# Patient Record
Sex: Female | Born: 1990 | Race: Black or African American | Hispanic: No | Marital: Single | Smoking: Current every day smoker
Health system: Southern US, Community
[De-identification: ages and names within clinical notes are randomized; demographics above are authoritative.]

## PROBLEM LIST (undated history)

## (undated) ENCOUNTER — Inpatient Hospital Stay (HOSPITAL_COMMUNITY): Payer: Self-pay

## (undated) DIAGNOSIS — B009 Herpesviral infection, unspecified: Secondary | ICD-10-CM

## (undated) DIAGNOSIS — Z5189 Encounter for other specified aftercare: Secondary | ICD-10-CM

## (undated) DIAGNOSIS — D649 Anemia, unspecified: Secondary | ICD-10-CM

## (undated) DIAGNOSIS — O139 Gestational [pregnancy-induced] hypertension without significant proteinuria, unspecified trimester: Secondary | ICD-10-CM

## (undated) DIAGNOSIS — I10 Essential (primary) hypertension: Secondary | ICD-10-CM

## (undated) HISTORY — DX: Herpesviral infection, unspecified: B00.9

## (undated) HISTORY — PX: WISDOM TOOTH EXTRACTION: SHX21

## (undated) HISTORY — PX: TONSILLECTOMY: SUR1361

---
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
## 2011-01-04 ENCOUNTER — Encounter: Payer: Self-pay | Admitting: *Deleted

## 2011-01-04 ENCOUNTER — Emergency Department (HOSPITAL_COMMUNITY)
Admission: EM | Admit: 2011-01-04 | Discharge: 2011-01-04 | Disposition: A | Discharge status: Home / Self Care | Payer: No Typology Code available for payment source | Attending: Emergency Medicine | Admitting: Emergency Medicine

## 2011-01-04 DIAGNOSIS — Y92410 Unspecified street and highway as the place of occurrence of the external cause: Secondary | ICD-10-CM | POA: Insufficient documentation

## 2011-01-04 DIAGNOSIS — T1490XA Injury, unspecified, initial encounter: Secondary | ICD-10-CM | POA: Insufficient documentation

## 2011-01-04 DIAGNOSIS — R109 Unspecified abdominal pain: Secondary | ICD-10-CM | POA: Insufficient documentation

## 2011-01-04 MED ORDER — IBUPROFEN 800 MG PO TABS: 800.0000 mg | ORAL_TABLET | Freq: Three times a day (TID) | ORAL | Status: AC

## 2011-01-04 MED ORDER — IBUPROFEN 800 MG PO TABS
800.0000 mg | ORAL_TABLET | Freq: Once | ORAL | Status: AC
  Administered 2011-01-04: 800 mg via ORAL
  Filled 2011-01-04: qty 1

## 2011-01-04 MED ORDER — CYCLOBENZAPRINE HCL 10 MG PO TABS: 10.0000 mg | ORAL_TABLET | Freq: Three times a day (TID) | ORAL | Status: AC | PRN

## 2011-01-04 MED ORDER — CYCLOBENZAPRINE HCL 10 MG PO TABS
10.0000 mg | ORAL_TABLET | Freq: Once | ORAL | Status: AC
  Administered 2011-01-04: 10 mg via ORAL
  Filled 2011-01-04: qty 1
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
## 2011-01-04 NOTE — ED Notes (Signed)
Rx given to pt. 
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
## 2011-01-05 NOTE — ED Provider Notes (Signed)
Medical screening examination/treatment/procedure(s) were performed by non-physician practitioner and as supervising physician I was immediately available for consultation/collaboration.  Tagen Brethauer, MD 01/05/11 0052 

## 2011-02-05 NOTE — L&D Delivery Note (Signed)
Delivery Note Pt reached complete dilation and pushed great.  At 2:00 PM a viable female was delivered via Vaginal, Spontaneous Delivery (Presentation: LOA).  APGAR: 8,9 ; weight pending .   Placenta status: manuel extraction--removed in pieces and fundal check is clear, .  Cord: 3 vessels with the following complications: corporal x 1    Anesthesia: Epidural  Episiotomy: none  Lacerations: none  Suture Repair: none Est. Blood Loss (mL): 300cc  Mom to postpartum.  Baby to stay with mom.Marland Kitchen  [name] 02/01/2012, 2:16 PM

## 2011-05-06 ENCOUNTER — Encounter (HOSPITAL_COMMUNITY): Payer: Self-pay | Admitting: *Deleted

## 2011-05-06 ENCOUNTER — Emergency Department (HOSPITAL_COMMUNITY)
Admission: EM | Admit: 2011-05-06 | Discharge: 2011-05-06 | Disposition: A | Discharge status: Home / Self Care | Payer: Self-pay | Attending: Emergency Medicine | Admitting: Emergency Medicine

## 2011-05-06 ENCOUNTER — Emergency Department (HOSPITAL_COMMUNITY): Payer: Self-pay

## 2011-05-06 DIAGNOSIS — R6883 Chills (without fever): Secondary | ICD-10-CM | POA: Insufficient documentation

## 2011-05-06 DIAGNOSIS — R1013 Epigastric pain: Secondary | ICD-10-CM | POA: Insufficient documentation

## 2011-05-06 DIAGNOSIS — R42 Dizziness and giddiness: Secondary | ICD-10-CM | POA: Insufficient documentation

## 2011-05-06 LAB — COMPREHENSIVE METABOLIC PANEL
ALT: 15 U/L (ref 0–35)
AST: 18 U/L (ref 0–37)
Albumin: 4.6 g/dL (ref 3.5–5.2)
Alkaline Phosphatase: 47 U/L (ref 39–117)
BUN: 13 mg/dL (ref 6–23)
Chloride: 101 mEq/L (ref 96–112)
Potassium: 4 mEq/L (ref 3.5–5.1)
Sodium: 138 mEq/L (ref 135–145)
Total Bilirubin: 0.4 mg/dL (ref 0.3–1.2)

## 2011-05-06 LAB — CBC
Hemoglobin: 12.1 g/dL (ref 12.0–15.0)
MCHC: 32.5 g/dL (ref 30.0–36.0)
RBC: 4.23 MIL/uL (ref 3.87–5.11)

## 2011-05-06 LAB — POCT PREGNANCY, URINE: Preg Test, Ur: NEGATIVE

## 2011-05-06 LAB — DIFFERENTIAL
Basophils Relative: 0 % (ref 0–1)
Monocytes Relative: 5 % (ref 3–12)
Neutro Abs: 3.9 10*3/uL (ref 1.7–7.7)
Neutrophils Relative %: 65 % (ref 43–77)

## 2011-05-06 LAB — URINALYSIS, ROUTINE W REFLEX MICROSCOPIC
Glucose, UA: NEGATIVE mg/dL
Leukocytes, UA: NEGATIVE
Protein, ur: NEGATIVE mg/dL
pH: 7.5 (ref 5.0–8.0)

## 2011-05-06 LAB — PREGNANCY, URINE: Preg Test, Ur: NEGATIVE

## 2011-05-06 MED ORDER — SODIUM CHLORIDE 0.9 % IV BOLUS (SEPSIS)
1000.0000 mL | Freq: Once | INTRAVENOUS | Status: AC
  Administered 2011-05-06: 1000 mL via INTRAVENOUS

## 2011-05-06 MED ORDER — IOHEXOL 300 MG/ML  SOLN
100.0000 mL | Freq: Once | INTRAMUSCULAR | Status: AC | PRN
  Administered 2011-05-06: 100 mL via INTRAVENOUS

## 2011-05-06 MED ORDER — IBUPROFEN 600 MG PO TABS: 600.0000 mg | ORAL_TABLET | Freq: Four times a day (QID) | ORAL | Status: AC | PRN

## 2011-05-06 MED ORDER — MORPHINE SULFATE 4 MG/ML IJ SOLN
4.0000 mg | Freq: Once | INTRAMUSCULAR | Status: AC
  Administered 2011-05-06: 4 mg via INTRAVENOUS
  Filled 2011-05-06: qty 1

## 2011-05-06 MED ORDER — KETOROLAC TROMETHAMINE 30 MG/ML IJ SOLN
30.0000 mg | Freq: Once | INTRAMUSCULAR | Status: AC
  Administered 2011-05-06: 30 mg via INTRAVENOUS
  Filled 2011-05-06: qty 1

## 2011-05-06 MED ORDER — FAMOTIDINE 20 MG PO TABS: 20.0000 mg | ORAL_TABLET | Freq: Two times a day (BID) | ORAL | Status: DC

## 2011-05-06 NOTE — ED Notes (Signed)
Pt states she started her menstrual cycle 4 days ago and started to have right side abdominal pain . Pt states the pain fells like a not. Pt denies any n/v/d. Pt states she is unsure if she can be pregnant

## 2011-05-06 NOTE — ED Notes (Signed)
Pt states "my stomach has been hurting for 4 days"; pt denies n/v/d

## 2011-05-06 NOTE — Discharge Instructions (Signed)
Abdominal Pain Abdominal pain can be caused by many things. Your caregiver decides the seriousness of your pain by an examination and possibly blood tests and X-rays. Many cases can be observed and treated at home. Most abdominal pain is not caused by a disease and will probably improve without treatment. However, in many cases, more time must pass before a clear cause of the pain can be found. Before that point, it may not be known if you need more testing, or if hospitalization or surgery is needed. HOME CARE INSTRUCTIONS   Do not take laxatives unless directed by your caregiver.   Take pain medicine only as directed by your caregiver.   Only take over-the-counter or prescription medicines for pain, discomfort, or fever as directed by your caregiver.   Try a clear liquid diet (broth, tea, or water) for as long as directed by your caregiver. Slowly move to a bland diet as tolerated.  SEEK IMMEDIATE MEDICAL CARE IF:   The pain does not go away.   You have a fever.   You keep throwing up (vomiting).   The pain is felt only in portions of the abdomen. Pain in the right side could possibly be appendicitis. In an adult, pain in the left lower portion of the abdomen could be colitis or diverticulitis.   You pass bloody or black tarry stools.  MAKE SURE YOU:   Understand these instructions.   Will watch your condition.   Will get help right away if you are not doing well or get worse.  Document Released: 10/31/2004 Document Revised: 01/10/2011 Document Reviewed: 09/09/2007 ExitCare Patient Information 2012 [employer]  RESOURCE GUIDE  Dental Problems  Patients with Medicaid: [facility]                     [facility] [address]                                           [address] Phone:  [phone]                                                   Phone:  [phone]  If unable to pay or uninsured, contact:  [facility] or [place] [facility] to become qualified for the adult dental clinic.  Chronic Pain Problems Contact [facility]  [phone] Patients need to be referred by their primary care doctor.  Insufficient Money for Medicine Contact [employer] Way:  call "211" or [facility] 271-5999.  No Primary Care Doctor Call [facility]  [phone] Other agencies that provide inexpensive medical care    [facility]  [phone]    [facility]  [phone]    [facility]  [phone]    [facility]  [phone]    [facility]  [phone]    [facility]  [phone]  Psychological Services [facility]  [phone] [facility]  [phone] [facility]   [phone] (emergency services [phone])  Abuse/Neglect [place] Child Abuse Hotline (336) 641-3795 [place] Child Abuse Hotline 800-378-5315 (After Hours)  Emergency Shelter  [facility] (336) 271-5985  Maternity Homes   [facility] (336) 275-9566 [facility] (704) 372-4663  MRSA Hotline #:   832-7006    [place] [facility]  [facility] [place]  [employer]                           [facility] [facility] [address] [place]                     [address]         [address]  [place]                                               [place]                              [place] Phone:  [phone]                                  Phone:  [phone]                   Phone:  [phone]  [facility] Phone:  [phone]  [place] Child Abuse Hotline (336) 342-1394 (336) 342-3537 (After Hours)  

## 2011-05-06 NOTE — ED Provider Notes (Signed)
History     CSN: [id number]  Arrival date & time 05/06/11  5784   First MD Initiated Contact with Patient 05/06/11 1005      Chief Complaint  Patient presents with  . Abdominal Pain    (Consider location/radiation/quality/duration/timing/severity/associated sxs/prior treatment) HPI  20yoF pw abdominal pain 8/10, LUQ and lt flank. Constant "knot like pain". Denies N/V/f. +Chills. Felt lightheaded yesterday at work. Not worsening with movement or eating. Denies back pain/vaginal discharge/constipation/diarrhea. Denies hematuria/dysuria/freq/urgency. Has tried midol, motrin, for the pain without relief. Currently at end of menstrual cycle, no vaginal bleeding today. Concerned about being pregnant. Denies h/o VTE in self or family. No recent hosp/surg/immob. No h/o cancer. Denies exogenous hormone use, no leg pain or swelling  No abdominal surgeries No h/o nephrolithiasis    Past Medical History  Diagnosis Date  . Asthma     as a child    Past Surgical History  Procedure Date  . Tonsillectomy     No family history on file.  History  Substance Use Topics  . Smoking status: Former Games developer  . Smokeless tobacco: Not on file  . Alcohol Use: No    OB History    Grav Para Term Preterm Abortions TAB SAB Ect Mult Living                  Review of Systems  All other systems reviewed and are negative.  except as noted HPI   Allergies  Review of patient's allergies indicates no known allergies.  Home Medications   Current Outpatient Rx  Name Route Sig Dispense Refill  . MIDOL MAXIMUM STRENGTH PO Oral Take 2 tablets by mouth every 4 (four) hours as needed. cramps    . IBUPROFEN 200 MG PO TABS Oral Take 400 mg by mouth every 6 (six) hours as needed. PAIN    . FAMOTIDINE 20 MG PO TABS Oral Take 1 tablet (20 mg total) by mouth 2 (two) times daily. 30 tablet 0  . IBUPROFEN 600 MG PO TABS Oral Take 1 tablet (600 mg total) by mouth every 6 (six) hours as needed for pain. 30  tablet 0    BP 102/65  Pulse 60  Temp(Src) 98.7 F (37.1 C) (Oral)  Resp 18  Wt 135 lb (61.236 kg)  SpO2 100%  LMP 05/02/2011  Physical Exam  Nursing note and vitals reviewed. Constitutional: She is oriented to person, place, and time. She appears well-developed.  HENT:  Head: Atraumatic.  Mouth/Throat: Oropharynx is clear and moist.  Eyes: Conjunctivae and EOM are normal. Pupils are equal, round, and reactive to light.  Neck: Normal range of motion. Neck supple.  Cardiovascular: Normal rate, regular rhythm, normal heart sounds and intact distal pulses.   Pulmonary/Chest: Effort normal and breath sounds normal. No respiratory distress. She has no wheezes. She has no rales.  Abdominal: Soft. She exhibits no distension. There is tenderness. There is no rebound and no guarding.       +epigastric ttp No lower abd pain  Musculoskeletal: Normal range of motion.  Neurological: She is alert and oriented to person, place, and time.  Skin: Skin is warm and dry. No rash noted.  Psychiatric: She has a normal mood and affect.    ED Course  Procedures (including critical care time)  Labs Reviewed  URINALYSIS, ROUTINE W REFLEX MICROSCOPIC - Abnormal; Notable for the following:    APPearance CLOUDY (*)    All other components within normal limits  CBC - Abnormal; Notable  for the following:    Platelets 417 (*)    All other components within normal limits  PREGNANCY, URINE  DIFFERENTIAL  COMPREHENSIVE METABOLIC PANEL  LIPASE, BLOOD   Ct Abdomen Pelvis W Contrast  05/06/2011  *RADIOLOGY REPORT*  Clinical Data: Left upper quadrant pain, weight loss  CT ABDOMEN AND PELVIS WITH CONTRAST  Technique:  Multidetector CT imaging of the abdomen and pelvis was performed following the standard protocol during bolus administration of intravenous contrast.  Contrast:  100 ml Omnipaque-300  Comparison: None.  Findings: Lung bases clear.  Normal heart size.  No pericardial or pleural effusion.  Abdomen:   Liver demonstrates focal fatty infiltration along the falciform ligament, image 20.  No other hepatic abnormality.  No biliary dilatation.  Hepatic and portal veins are patent. Gallbladder, biliary system, pancreas, spleen, and adrenal glands within normal limits for age and demonstrate no acute finding. Kidneys are imaged in the early cortical phase.  Small incidental cortical cyst noted in the upper pole of the right kidney, sub centimeter in size.  No hydronephrosis or obstructing urinary tract calculus.  Negative for bowel obstruction, dilatation, ileus, or free air. Normal appendix in the right lower quadrant containing air. Portions of the small bowel and colon are collapsed and non opacified with contrast.  Pelvis:  Small amount pelvic free fluid, nonspecific.  No distal obstructing urinary tract calculus or bladder abnormality.  Uterus is midline.  No adnexal abnormality appreciated.  No acute osseous abnormality.  IMPRESSION: No acute intra-abdominal or pelvic process by CT.  Incidental tiny renal cysts  Normal appendix  Collapsed under distended colon, limited evaluation  Small amount of nonspecific pelvic free fluid.  Original Report Authenticated By: Judie Petit. Ruel Favors, M.D.     1. Abdominal pain      MDM  Her abdominal pain which has been constant for the past 4 days. She has minimal epigastric tenderness to palpation on exam. Her labs have been reviewed and are unremarkable including her urinalysis, urine pregnancy. She's been given morphine with minimal pain. Toradol ordered. CT abdomen and pelvis small amount of free pelvic fluid. She has no lower abdominal tenderness to palpation and I do not feel that an ultrasound of her pelvis is warranted at this time. Pain control at home with ibuprofen, Pepcid, followup with primary care as needed. Precautions for return.        Forbes Cellar, MD 05/06/11 1447

## 2011-05-06 NOTE — ED Notes (Signed)
Pt taken to CT.

## 2011-10-24 LAB — OB RESULTS CONSOLE HIV ANTIBODY (ROUTINE TESTING): HIV: NONREACTIVE

## 2011-10-24 LAB — OB RESULTS CONSOLE RPR: RPR: NONREACTIVE

## 2012-01-31 ENCOUNTER — Telehealth (HOSPITAL_COMMUNITY): Payer: Self-pay | Admitting: *Deleted

## 2012-01-31 ENCOUNTER — Encounter (HOSPITAL_COMMUNITY): Payer: Self-pay | Admitting: *Deleted

## 2012-01-31 NOTE — Telephone Encounter (Signed)
Preadmission screen  

## 2012-01-31 NOTE — H&P (Signed)
[name] is a 21 y.o. female G2P1001 at 36 weeks (EDD 02/08/12 by 23 week [place]) presenting for IOL at term with favorable cervix.  PNC uncomplicated except late to care at 24 weeks and a h/o preterm cervical change with last baby at 23 weeks, requiring bedrest and progesterone suppositories.  She received weekly 17-progesterone this pregnancy from 24-36 week.  She has a h/o HSV but is on suppression with valtrex.  SHe is also GBS positive.  Maternal Medical History:  Contractions: Frequency: irregular.   Perceived severity is mild.    Fetal activity: Perceived fetal activity is normal.      OB History    Grav Para Term Preterm Abortions TAB SAB Ect Mult Living   1 1 1       1     NSVD x 1 2010 6#14oz   Past Medical History  Diagnosis Date  . Asthma     as a child  . Herpes    Past Surgical History  Procedure Date  . Tonsillectomy   . Wisdom tooth extraction    Family History: family history includes Alcohol abuse in her mother; Asthma in her mother; Diabetes in her maternal grandmother; Hypertension in her father; and Seizures in her son. Social History:  reports that she has quit smoking. She does not have any smokeless tobacco history on file. She reports that she does not drink alcohol or use illicit drugs.   Prenatal Transfer Tool  Maternal Diabetes: No Genetic Screening: too late to care Maternal Ultrasounds/Referrals: Normal Fetal Ultrasounds or other Referrals:  None Maternal Substance Abuse:  No Significant Maternal Medications:  Meds include: Other:  Valtrex and 17-progesterone Significant Maternal Lab Results:  Lab values include: Group B Strep positive Other Comments:  None  ROS    Last menstrual period 04/22/2011, unknown if currently breastfeeding. Maternal Exam:  Uterine Assessment: Contraction strength is mild.  Contraction frequency is irregular.   Abdomen: Patient reports no abdominal tenderness. Fetal presentation: vertex  Introitus: Normal vulva.  Normal vagina.    Physical Exam  Constitutional: She is oriented to person, place, and time. She appears well-developed and well-nourished.  Cardiovascular: Normal rate and regular rhythm.   Respiratory: Effort normal and breath sounds normal.  GI: Soft.  Genitourinary: Vagina normal and uterus normal.  Neurological: She is alert and oriented to person, place, and time.  Psychiatric: She has a normal mood and affect. Her behavior is normal.    Prenatal labs: ABO, Rh:  O positive Antibody:  negative Rubella:  Immune RPR: Nonreactive (09/19 0000)  HBsAg:   Neg HIV: Non-reactive (09/19 0000)  GBS: Positive (12/10 0000)  One hour GTT 56 Too late for genetic screens CF negative  Assessment/Plan: Pt for IOL at term, favorable cervix.  Plan ROM and pitocin.  PCN for +GBS  Adib Wahba W 01/31/2012, 5:34 PM

## 2012-02-01 ENCOUNTER — Encounter (HOSPITAL_COMMUNITY): Payer: Self-pay | Admitting: Anesthesiology

## 2012-02-01 ENCOUNTER — Inpatient Hospital Stay (HOSPITAL_COMMUNITY): Payer: Medicaid Other | Admitting: Anesthesiology

## 2012-02-01 ENCOUNTER — Encounter (HOSPITAL_COMMUNITY): Payer: Self-pay

## 2012-02-01 ENCOUNTER — Inpatient Hospital Stay (HOSPITAL_COMMUNITY)
Admission: RE | Admit: 2012-02-01 | Discharge: 2012-02-03 | DRG: 774 | Disposition: A | Discharge status: Home / Self Care | Payer: Medicaid Other | Source: Ambulatory Visit | Attending: Obstetrics and Gynecology | Admitting: Obstetrics and Gynecology

## 2012-02-01 LAB — CBC
HCT: 35.7 % — ABNORMAL LOW (ref 36.0–46.0)
MCH: 28.1 pg (ref 26.0–34.0)
MCHC: 32.8 g/dL (ref 30.0–36.0)
MCV: 85.8 fL (ref 78.0–100.0)
RDW: 15.3 % (ref 11.5–15.5)

## 2012-02-01 LAB — ABO/RH: ABO/RH(D): O POS

## 2012-02-01 MED ORDER — LIDOCAINE HCL (PF) 1 % IJ SOLN
INTRAMUSCULAR | Status: DC | PRN
  Administered 2012-02-01 (×2): 5 mL

## 2012-02-01 MED ORDER — TETANUS-DIPHTH-ACELL PERTUSSIS 5-2.5-18.5 LF-MCG/0.5 IM SUSP: 0.5000 mL | Freq: Once | INTRAMUSCULAR | Status: DC

## 2012-02-01 MED ORDER — DIPHENHYDRAMINE HCL 50 MG/ML IJ SOLN: 12.5000 mg | INTRAMUSCULAR | Status: DC | PRN

## 2012-02-01 MED ORDER — LACTATED RINGERS IV SOLN
INTRAVENOUS | Status: DC
  Administered 2012-02-01 (×2): via INTRAVENOUS

## 2012-02-01 MED ORDER — CITRIC ACID-SODIUM CITRATE 334-500 MG/5ML PO SOLN: 30.0000 mL | ORAL | Status: DC | PRN

## 2012-02-01 MED ORDER — ZOLPIDEM TARTRATE 5 MG PO TABS: 5.0000 mg | ORAL_TABLET | Freq: Every evening | ORAL | Status: DC | PRN

## 2012-02-01 MED ORDER — LIDOCAINE HCL (PF) 1 % IJ SOLN: 30.0000 mL | INTRAMUSCULAR | Status: DC | PRN

## 2012-02-01 MED ORDER — OXYTOCIN 40 UNITS IN LACTATED RINGERS INFUSION - SIMPLE MED
1.0000 m[IU]/min | INTRAVENOUS | Status: DC
  Administered 2012-02-01: 2 m[IU]/min via INTRAVENOUS
  Filled 2012-02-01: qty 1000

## 2012-02-01 MED ORDER — PENICILLIN G POTASSIUM 5000000 UNITS IJ SOLR
2.5000 10*6.[IU] | INTRAVENOUS | Status: DC
  Administered 2012-02-01: 2.5 10*6.[IU] via INTRAVENOUS
  Filled 2012-02-01 (×5): qty 2.5

## 2012-02-01 MED ORDER — ONDANSETRON HCL 4 MG/2ML IJ SOLN: 4.0000 mg | Freq: Four times a day (QID) | INTRAMUSCULAR | Status: DC | PRN

## 2012-02-01 MED ORDER — DIPHENHYDRAMINE HCL 25 MG PO CAPS
25.0000 mg | ORAL_CAPSULE | Freq: Four times a day (QID) | ORAL | Status: DC | PRN
  Filled 2012-02-01: qty 1

## 2012-02-01 MED ORDER — IBUPROFEN 600 MG PO TABS
600.0000 mg | ORAL_TABLET | Freq: Four times a day (QID) | ORAL | Status: DC
  Administered 2012-02-01 – 2012-02-03 (×8): 600 mg via ORAL
  Filled 2012-02-01 (×9): qty 1

## 2012-02-01 MED ORDER — SIMETHICONE 80 MG PO CHEW: 80.0000 mg | CHEWABLE_TABLET | ORAL | Status: DC | PRN

## 2012-02-01 MED ORDER — ACETAMINOPHEN 325 MG PO TABS: 650.0000 mg | ORAL_TABLET | ORAL | Status: DC | PRN

## 2012-02-01 MED ORDER — OXYCODONE-ACETAMINOPHEN 5-325 MG PO TABS: 1.0000 | ORAL_TABLET | ORAL | Status: DC | PRN

## 2012-02-01 MED ORDER — PRENATAL MULTIVITAMIN CH
1.0000 | ORAL_TABLET | Freq: Every day | ORAL | Status: DC
  Administered 2012-02-01 – 2012-02-03 (×3): 1 via ORAL
  Filled 2012-02-01 (×3): qty 1

## 2012-02-01 MED ORDER — LACTATED RINGERS IV SOLN: 500.0000 mL | Freq: Once | INTRAVENOUS | Status: DC

## 2012-02-01 MED ORDER — FENTANYL 2.5 MCG/ML BUPIVACAINE 1/10 % EPIDURAL INFUSION (WH - ANES)
14.0000 mL/h | INTRAMUSCULAR | Status: DC
  Administered 2012-02-01: 14 mL/h via EPIDURAL
  Filled 2012-02-01: qty 125

## 2012-02-01 MED ORDER — ONDANSETRON HCL 4 MG/2ML IJ SOLN: 4.0000 mg | INTRAMUSCULAR | Status: DC | PRN

## 2012-02-01 MED ORDER — PHENYLEPHRINE 40 MCG/ML (10ML) SYRINGE FOR IV PUSH (FOR BLOOD PRESSURE SUPPORT)
80.0000 ug | PREFILLED_SYRINGE | INTRAVENOUS | Status: DC | PRN
  Filled 2012-02-01: qty 5

## 2012-02-01 MED ORDER — EPHEDRINE 5 MG/ML INJ
10.0000 mg | INTRAVENOUS | Status: DC | PRN
  Filled 2012-02-01: qty 4

## 2012-02-01 MED ORDER — PHENYLEPHRINE 40 MCG/ML (10ML) SYRINGE FOR IV PUSH (FOR BLOOD PRESSURE SUPPORT): 80.0000 ug | PREFILLED_SYRINGE | INTRAVENOUS | Status: DC | PRN

## 2012-02-01 MED ORDER — BENZOCAINE-MENTHOL 20-0.5 % EX AERO: 1.0000 "application " | INHALATION_SPRAY | CUTANEOUS | Status: DC | PRN

## 2012-02-01 MED ORDER — DIBUCAINE 1 % RE OINT: 1.0000 "application " | TOPICAL_OINTMENT | RECTAL | Status: DC | PRN

## 2012-02-01 MED ORDER — OXYTOCIN 40 UNITS IN LACTATED RINGERS INFUSION - SIMPLE MED: 62.5000 mL/h | INTRAVENOUS | Status: DC

## 2012-02-01 MED ORDER — ONDANSETRON HCL 4 MG PO TABS: 4.0000 mg | ORAL_TABLET | ORAL | Status: DC | PRN

## 2012-02-01 MED ORDER — TERBUTALINE SULFATE 1 MG/ML IJ SOLN: 0.2500 mg | Freq: Once | INTRAMUSCULAR | Status: DC | PRN

## 2012-02-01 MED ORDER — EPHEDRINE 5 MG/ML INJ: 10.0000 mg | INTRAVENOUS | Status: DC | PRN

## 2012-02-01 MED ORDER — OXYTOCIN BOLUS FROM INFUSION: 500.0000 mL | INTRAVENOUS | Status: DC

## 2012-02-01 MED ORDER — PENICILLIN G POTASSIUM 5000000 UNITS IJ SOLR
5.0000 10*6.[IU] | Freq: Once | INTRAVENOUS | Status: AC
  Administered 2012-02-01: 5 10*6.[IU] via INTRAVENOUS
  Filled 2012-02-01: qty 5

## 2012-02-01 MED ORDER — LACTATED RINGERS IV SOLN: 500.0000 mL | INTRAVENOUS | Status: DC | PRN

## 2012-02-01 MED ORDER — SENNOSIDES-DOCUSATE SODIUM 8.6-50 MG PO TABS
2.0000 | ORAL_TABLET | Freq: Every day | ORAL | Status: DC
  Administered 2012-02-01 – 2012-02-02 (×2): 2 via ORAL

## 2012-02-01 MED ORDER — WITCH HAZEL-GLYCERIN EX PADS: 1.0000 "application " | MEDICATED_PAD | CUTANEOUS | Status: DC | PRN

## 2012-02-01 MED ORDER — IBUPROFEN 600 MG PO TABS: 600.0000 mg | ORAL_TABLET | Freq: Four times a day (QID) | ORAL | Status: DC | PRN

## 2012-02-01 MED ORDER — LANOLIN HYDROUS EX OINT: TOPICAL_OINTMENT | CUTANEOUS | Status: DC | PRN

## 2012-02-01 NOTE — Progress Notes (Signed)
Patient ID: [name], female   DOB: 1990-08-30, 21 y.o.   MRN: [id number] Getting uncomfortable with contractions FHR reassuring Clear fluid 80/4/-1  Follow progress

## 2012-02-01 NOTE — Progress Notes (Signed)
Patient ID: [name], female   DOB: [birth year], 21 y.o.   MRN: [id number] Pt admitted and received PCN for GBS prophylaxis FHR reactive Cervix 70/2/-2 possterior AROM clear  On pitocin and will follow progress.

## 2012-02-01 NOTE — Anesthesia Procedure Notes (Signed)
Epidural Patient location during procedure: OB Start time: 02/01/2012 12:00 PM  Staffing Anesthesiologist: Angus Seller., [facility]. Performed by: anesthesiologist   Preanesthetic Checklist Completed: patient identified, site marked, surgical consent, pre-op evaluation, timeout performed, IV checked, risks and benefits discussed and monitors and equipment checked  Epidural Patient position: sitting Prep: site prepped and draped and DuraPrep Patient monitoring: continuous pulse ox and blood pressure Approach: midline Injection technique: LOR air and LOR saline  Needle:  Needle type: Tuohy  Needle gauge: 17 G Needle length: 9 cm and 9 Needle insertion depth: 5 cm cm Catheter type: closed end flexible Catheter size: 19 Gauge Catheter at skin depth: 10 cm Test dose: negative  Assessment Events: blood not aspirated, injection not painful, no injection resistance, negative IV test and no paresthesia  Additional Notes Patient identified.  Risk benefits discussed including failed block, incomplete pain control, headache, nerve damage, paralysis, blood pressure changes, nausea, vomiting, reactions to medication both toxic or allergic, and postpartum back pain.  Patient expressed understanding and wished to proceed.  All questions were answered.  Sterile technique used throughout procedure and epidural site dressed with sterile barrier dressing. No paresthesia or other complications noted.The patient did not experience any signs of intravascular injection such as tinnitus or metallic taste in mouth nor signs of intrathecal spread such as rapid motor block. Please see nursing notes for vital signs.

## 2012-02-01 NOTE — Anesthesia Preprocedure Evaluation (Signed)
Anesthesia Evaluation  Patient identified by MRN, date of birth, ID band Patient awake    Reviewed: Allergy & Precautions, H&P , Patient's Chart, lab work & pertinent test results  Airway Mallampati: II TM Distance: >3 FB Neck ROM: full    Dental No notable dental hx.    Pulmonary neg pulmonary ROS, asthma ,  breath sounds clear to auscultation  Pulmonary exam normal       Cardiovascular negative cardio ROS  Rhythm:regular Rate:Normal     Neuro/Psych negative neurological ROS  negative psych ROS   GI/Hepatic negative GI ROS, Neg liver ROS,   Endo/Other  negative endocrine ROS  Renal/GU negative Renal ROS     Musculoskeletal   Abdominal   Peds  Hematology negative hematology ROS (+)   Anesthesia Other Findings   Reproductive/Obstetrics (+) Pregnancy                           Anesthesia Physical Anesthesia Plan  ASA: II  Anesthesia Plan: Epidural   Post-op Pain Management:    Induction:   Airway Management Planned:   Additional Equipment:   Intra-op Plan:   Post-operative Plan:   Informed Consent: I have reviewed the patients History and Physical, chart, labs and discussed the procedure including the risks, benefits and alternatives for the proposed anesthesia with the patient or authorized representative who has indicated his/her understanding and acceptance.     Plan Discussed with:   Anesthesia Plan Comments:         Anesthesia Quick Evaluation  

## 2012-02-02 LAB — CBC
HCT: 31.6 % — ABNORMAL LOW (ref 36.0–46.0)
Hemoglobin: 10.4 g/dL — ABNORMAL LOW (ref 12.0–15.0)
MCV: 85.9 fL (ref 78.0–100.0)
RBC: 3.68 MIL/uL — ABNORMAL LOW (ref 3.87–5.11)
RDW: 15.5 % (ref 11.5–15.5)
WBC: 12.9 10*3/uL — ABNORMAL HIGH (ref 4.0–10.5)

## 2012-02-02 NOTE — Progress Notes (Signed)
Post Partum Day 1 Subjective: no complaints and tolerating PO  Objective: Blood pressure 111/72, pulse 92, temperature 97.9 F (36.6 C), temperature source Oral, resp. rate 18, height 5\' 6"  (1.676 m), weight 68.947 kg (152 lb), last menstrual period 04/22/2011, SpO2 98.00%, unknown if currently breastfeeding.  Physical Exam:  General: alert and cooperative Lochia: appropriate Uterine Fundus: firm    Basename 02/02/12 0526 02/01/12 0710  HGB 10.4* 11.7*  HCT 31.6* 35.7*    Assessment/Plan: Plan for discharge tomorrow   LOS: 1 day   Nori Poland W 02/02/2012, 10:27 AM

## 2012-02-02 NOTE — Anesthesia Postprocedure Evaluation (Signed)
Anesthesia Post Note  Patient: [name]  Procedure(s) Performed: * No procedures listed *  Anesthesia type: Epidural  Patient location: Mother/Baby  Post pain: Pain level controlled  Post assessment: Post-op Vital signs reviewed  Last Vitals:  Filed Vitals:   02/02/12 0520  BP: 111/72  Pulse: 92  Temp: 36.6 C  Resp: 18    Post vital signs: Reviewed  Level of consciousness:alert  Complications: No apparent anesthesia complications

## 2012-02-03 MED ORDER — IBUPROFEN 600 MG PO TABS: 600.0000 mg | ORAL_TABLET | Freq: Four times a day (QID) | ORAL | Status: DC

## 2012-02-03 NOTE — Discharge Summary (Signed)
Obstetric Discharge Summary Reason for Admission: induction of labor Prenatal Procedures: none Intrapartum Procedures: spontaneous vaginal delivery Postpartum Procedures: none Complications-Operative and Postpartum: none Hemoglobin  Date Value Range Status  02/02/2012 10.4* 12.0 - 15.0 g/dL Final     HCT  Date Value Range Status  02/02/2012 31.6* 36.0 - 46.0 % Final    Physical Exam:  General: alert and cooperative Lochia: appropriate Uterine Fundus: firm   Discharge Diagnoses: Term Pregnancy-delivered  Discharge Information: Date: 02/03/2012 Activity: pelvic rest Diet: routine Medications: Ibuprofen Condition: improved Instructions: refer to practice specific booklet Discharge to: home Follow-up Information    Follow up with Oliver Pila, MD. Schedule an appointment as soon as possible for a visit in 6 weeks. (Also call this week to set up circumcision)    Contact information:   [address], [address] [place] [address] [phone]          Newborn Data: Live born female  Birth Weight: 6 lb 4.9 oz (2860 g) APGAR: 8, 9  Home with mother.  Oliver Pila 02/03/2012, 8:22 AM

## 2012-02-03 NOTE — Progress Notes (Signed)
Post Partum Day 2 Subjective: no complaints and tolerating PO  Objective: Blood pressure 122/77, pulse 67, temperature 97.9 F (36.6 C), temperature source Oral, resp. rate 18, height 5\' 6"  (1.676 m), weight 68.947 kg (152 lb), last menstrual period 04/22/2011, SpO2 98.00%, unknown if currently breastfeeding.  Physical Exam:  General: alert and cooperative Lochia: appropriate Uterine Fundus: firm    Basename 02/02/12 0526 02/01/12 0710  HGB 10.4* 11.7*  HCT 31.6* 35.7*    Assessment/Plan: Discharge home Motrin Plans circumcision in office   LOS: 2 days   Mumin Denomme W 02/03/2012, 8:20 AM

## 2012-02-03 NOTE — Progress Notes (Signed)
UR chart review completed.  

## 2012-02-08 ENCOUNTER — Inpatient Hospital Stay (HOSPITAL_COMMUNITY): Admission: AD | Admit: 2012-02-08 | Payer: Self-pay | Source: Ambulatory Visit | Admitting: Obstetrics and Gynecology

## 2013-06-22 ENCOUNTER — Emergency Department (HOSPITAL_COMMUNITY)
Admission: EM | Admit: 2013-06-22 | Discharge: 2013-06-22 | Disposition: A | Discharge status: Home / Self Care | Payer: Medicaid Other | Attending: Emergency Medicine | Admitting: Emergency Medicine

## 2013-06-22 ENCOUNTER — Encounter (HOSPITAL_COMMUNITY): Payer: Self-pay | Admitting: Emergency Medicine

## 2013-06-22 DIAGNOSIS — K044 Acute apical periodontitis of pulpal origin: Secondary | ICD-10-CM | POA: Insufficient documentation

## 2013-06-22 DIAGNOSIS — Z791 Long term (current) use of non-steroidal anti-inflammatories (NSAID): Secondary | ICD-10-CM | POA: Insufficient documentation

## 2013-06-22 DIAGNOSIS — R221 Localized swelling, mass and lump, neck: Secondary | ICD-10-CM

## 2013-06-22 DIAGNOSIS — R509 Fever, unspecified: Secondary | ICD-10-CM | POA: Insufficient documentation

## 2013-06-22 DIAGNOSIS — Z79899 Other long term (current) drug therapy: Secondary | ICD-10-CM | POA: Insufficient documentation

## 2013-06-22 DIAGNOSIS — K047 Periapical abscess without sinus: Secondary | ICD-10-CM

## 2013-06-22 DIAGNOSIS — Z87891 Personal history of nicotine dependence: Secondary | ICD-10-CM | POA: Insufficient documentation

## 2013-06-22 DIAGNOSIS — J45909 Unspecified asthma, uncomplicated: Secondary | ICD-10-CM | POA: Insufficient documentation

## 2013-06-22 DIAGNOSIS — K08109 Complete loss of teeth, unspecified cause, unspecified class: Secondary | ICD-10-CM | POA: Insufficient documentation

## 2013-06-22 DIAGNOSIS — R220 Localized swelling, mass and lump, head: Secondary | ICD-10-CM | POA: Insufficient documentation

## 2013-06-22 DIAGNOSIS — Z8619 Personal history of other infectious and parasitic diseases: Secondary | ICD-10-CM | POA: Insufficient documentation

## 2013-06-22 DIAGNOSIS — K0889 Other specified disorders of teeth and supporting structures: Secondary | ICD-10-CM

## 2013-06-22 MED ORDER — HYDROCODONE-ACETAMINOPHEN 5-325 MG PO TABS: 1.0000 | ORAL_TABLET | ORAL | Status: DC | PRN

## 2013-06-22 MED ORDER — AMOXICILLIN 500 MG PO CAPS: 500.0000 mg | ORAL_CAPSULE | Freq: Three times a day (TID) | ORAL | Status: DC

## 2013-06-22 NOTE — ED Notes (Addendum)
Pt c/o right lower dental pain x months, states it has gotten worse overt the past week. Pt does have appt on thurs with dentist but here because of pain.

## 2013-06-22 NOTE — ED Provider Notes (Signed)
Medical screening examination/treatment/procedure(s) were performed by non-physician practitioner and as supervising physician I was immediately available for consultation/collaboration.   EKG Interpretation None        Theodosia Bahena E Macarthur Lorusso, MD 06/22/13 1917 

## 2013-06-22 NOTE — Discharge Instructions (Signed)
Take antibiotic to completion. Take Vicodin for severe pain only. No driving or operating heavy machinery while taking vicodin. This medication may cause drowsiness. Followup with the dentist as scheduled.  Dental Pain A tooth ache may be caused by cavities (tooth decay). Cavities expose the nerve of the tooth to air and hot or cold temperatures. It may come from an infection or abscess (also called a boil or furuncle) around your tooth. It is also often caused by dental caries (tooth decay). This causes the pain you are having. DIAGNOSIS  Your caregiver can diagnose this problem by exam. TREATMENT   If caused by an infection, it may be treated with medications which kill germs (antibiotics) and pain medications as prescribed by your caregiver. Take medications as directed.  Only take over-the-counter or prescription medicines for pain, discomfort, or fever as directed by your caregiver.  Whether the tooth ache today is caused by infection or dental disease, you should see your dentist as soon as possible for further care. SEEK MEDICAL CARE IF: The exam and treatment you received today has been provided on an emergency basis only. This is not a substitute for complete medical or dental care. If your problem worsens or new problems (symptoms) appear, and you are unable to meet with your dentist, call or return to this location. SEEK IMMEDIATE MEDICAL CARE IF:   You have a fever.  You develop redness and swelling of your face, jaw, or neck.  You are unable to open your mouth.  You have severe pain uncontrolled by pain medicine. MAKE SURE YOU:   Understand these instructions.  Will watch your condition.  Will get help right away if you are not doing well or get worse. Document Released: 01/21/2005 Document Revised: 04/15/2011 Document Reviewed: 09/09/2007 [facility] Patient Information 2014 [place], [place].  Facial Infection You have an infection of your face. This requires special  attention to help prevent serious problems. Infections in facial wounds can cause poor healing and scars. They can also spread to deeper tissues, especially around the eye. Wound and dental infections can lead to sinusitis, infection of the eye socket, and even meningitis. Permanent damage to the skin, eye, and nervous system may result if facial infections are not treated properly. With severe infections, hospital care for IV antibiotic injections may be needed if they don't respond to oral antibiotics. Antibiotics must be taken for the full course to insure the infection is eliminated. If the infection came from a bad tooth, it may have to be extracted when the infection is under control. Warm compresses may be applied to reduce skin irritation and remove drainage. You might need a tetanus shot now if:  You cannot remember when your last tetanus shot was.  You have never had a tetanus shot.  The object that caused your wound was dirty. If you need a tetanus shot, and you decide not to get one, there is a rare chance of getting tetanus. Sickness from tetanus can be serious. If you got a tetanus shot, your arm may swell, get red and warm to the touch at the shot site. This is common and not a problem. SEEK IMMEDIATE MEDICAL CARE IF:   You have increased swelling, redness, or trouble breathing.  You have a severe headache, dizziness, nausea, or vomiting.  You develop problems with your eyesight.  You have a fever. Document Released: 02/29/2004 Document Revised: 04/15/2011 Document Reviewed: 01/21/2005 [facility] Patient Information 2014 [place], [place].

## 2013-06-22 NOTE — ED Provider Notes (Signed)
CSN: [id number]     Arrival date & time 06/22/13  1824 History  This chart was scribed for Loretta Gourdobyn Albert, PA working with Loretta RootsKevin E Steinl, MD, by [name], ED Scribe. This patient was seen in room WTR8/WTR8 and the patient's care was started at 6:38 PM.      Chief Complaint  Patient presents with  . Dental Pain     The history is provided by the patient. No language interpreter was used.   HPI Comments: [name] is a 23 y.o. female who presents to the Emergency Department complaining of gradually worsening, right lower dental pain that began 1 month ago. There is associated fever (triage temp 99.3 F, had pain reliever 2 hours PTA), right sided facial swelling, and sensitivity to air. Patient states the pain is worse when swallowing. Patient states she has taken OTC pain reliever with little improvement. Patient states she has an appointment with her dentist on Thursday, but came to the ED today due to the severe pain. Denies difficulty swallowing.   Past Medical History  Diagnosis Date  . Asthma     as a child  . Herpes    Past Surgical History  Procedure Laterality Date  . Tonsillectomy    . Wisdom tooth extraction     Family History  Problem Relation Age of Onset  . Asthma Mother   . Alcohol abuse Mother   . Hypertension Father   . Seizures Son     febrile  . Diabetes Maternal Grandmother    History  Substance Use Topics  . Smoking status: Former Games developermoker  . Smokeless tobacco: Not on file  . Alcohol Use: No   OB History   Grav Para Term Preterm Abortions TAB SAB Ect Mult Living   2 2 2       2      Review of Systems  Constitutional: Positive for fever.  HENT: Positive for dental problem.   All other systems reviewed and are negative.     Allergies  Review of patient's allergies indicates no known allergies.  Home Medications   Prior to Admission medications   Medication Sig Start Date End Date Taking? Authorizing Provider  ibuprofen  (ADVIL,MOTRIN) 600 MG tablet Take 1 tablet (600 mg total) by mouth every 6 (six) hours. 02/03/12   Oliver PilaKathy W [name], MD  Prenatal Vit-Fe Fumarate-FA (PRENATAL MULTIVITAMIN) TABS Take 1 tablet by mouth daily.    Historical Provider, MD   Triage Vitals: BP 122/76  Pulse 86  Temp(Src) 99.3 F (37.4 C) (Oral)  Resp 17  SpO2 100%  LMP 06/20/2013  Physical Exam  Nursing note and vitals reviewed. Constitutional: She is oriented to person, place, and time. She appears well-developed and well-nourished. No distress.  HENT:  Head: Normocephalic and atraumatic.  Mouth/Throat: Oropharynx is clear and moist.  TTP right lower first molar with surrounding erythema and no abscess. Swallows secretions well.  Eyes: Conjunctivae and EOM are normal.  Neck: Normal range of motion. Neck supple.  Cardiovascular: Normal rate, regular rhythm and normal heart sounds.   Pulmonary/Chest: Effort normal and breath sounds normal. No respiratory distress.  Musculoskeletal: Normal range of motion. She exhibits no edema.  Lymphadenopathy:       Head (right side): Submandibular adenopathy present.  Neurological: She is alert and oriented to person, place, and time. No sensory deficit.  Skin: Skin is warm and dry.  Psychiatric: She has a normal mood and affect. Her behavior is normal.    ED  Course  Procedures (including critical care time)  DIAGNOSTIC STUDIES: Oxygen Saturation is 100% on room air, normal by my interpretation.    COORDINATION OF CARE: At 1845 Discussed treatment plan with patient which includes antibiotics and hydrocodone. Patient agrees.   Labs Review Labs Reviewed - No data to display  Imaging Review No results found.   EKG Interpretation None      MDM   Final diagnoses:  Dental infection  Pain, dental     Dental pain associated with dental infection. No evidence of dental abscess. Patient is afebrile, non toxic appearing and swallowing secretions well. I gave patient  referral to dentist and stressed the importance of dental follow up for ultimate management of dental pain. I will also give amoxicillin and pain control. Patient voices understanding and is agreeable to plan.  I personally performed the services described in this documentation, which was scribed in my presence. The recorded information has been reviewed and is accurate.    Loretta MaceRobyn M Albert, PA-C 06/22/13 [phone]

## 2013-09-23 IMAGING — CT CT ABD-PELV W/ CM
1 of 2 series · 15 of 32 positions shown, 19 images · IV contrast (100 ML OMNI 300)
Comparison: None.

CLINICAL DATA: Left upper quadrant pain, weight loss

CT ABDOMEN AND PELVIS WITH CONTRAST
TECHNIQUE: Multidetector CT imaging of the abdomen and pelvis was
performed following the standard protocol during bolus
administration of intravenous contrast.
Contrast:  100 ml 7mnipaque-WII

[Series 2: abd/pel with · axial · 0.64mm/px · z∈[+1172,+1562]mm · 15 of 86 slices shown, 19 images]
[im 4/86  soft-tissue]
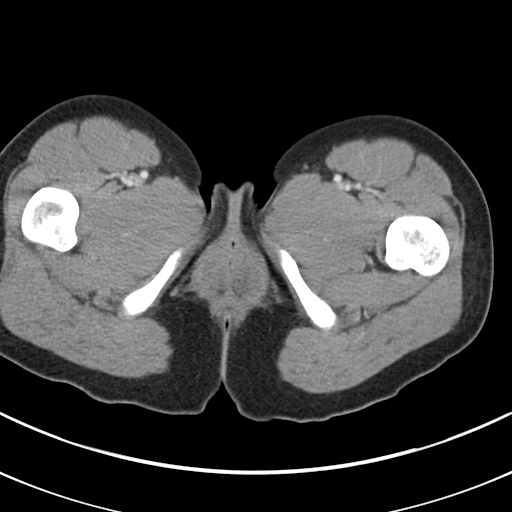
[im 4/86  bone]
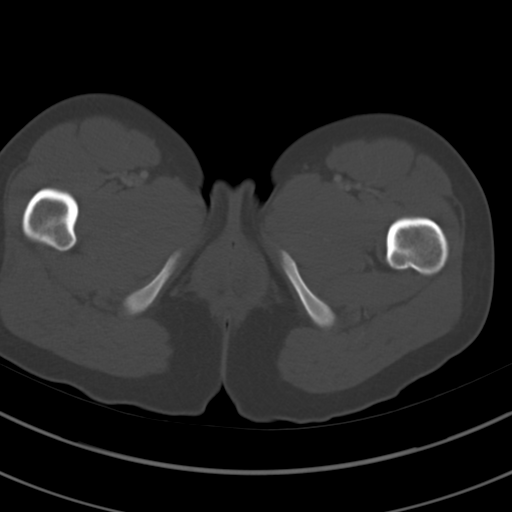
[im 10/86  soft-tissue]
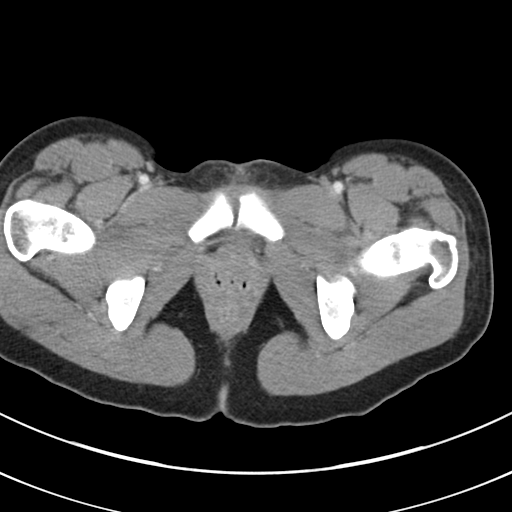
[im 17/86  soft-tissue]
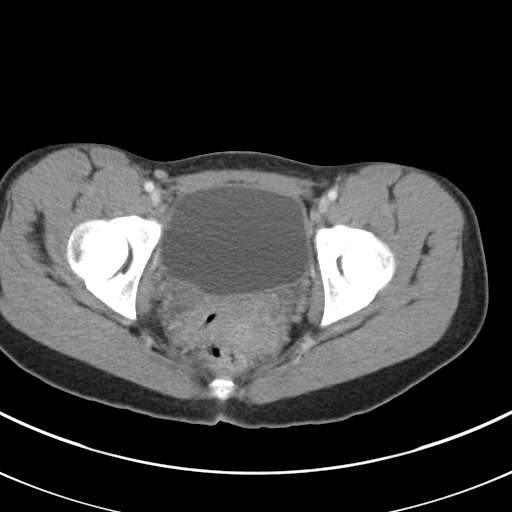
[im 23/86  soft-tissue]
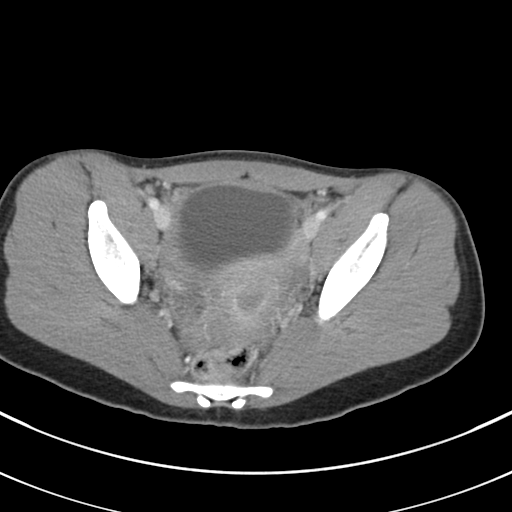
[im 30/86  soft-tissue]
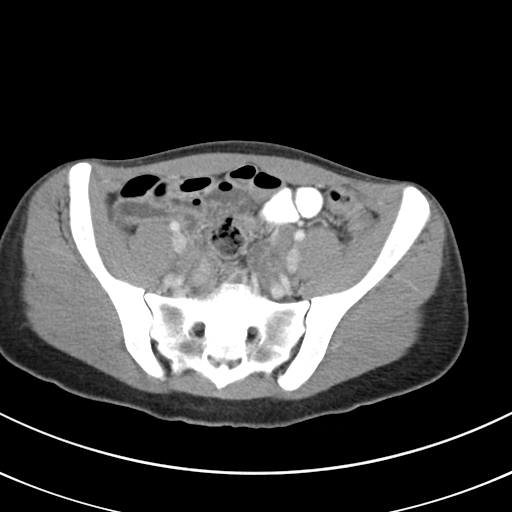
[im 36/86  soft-tissue]
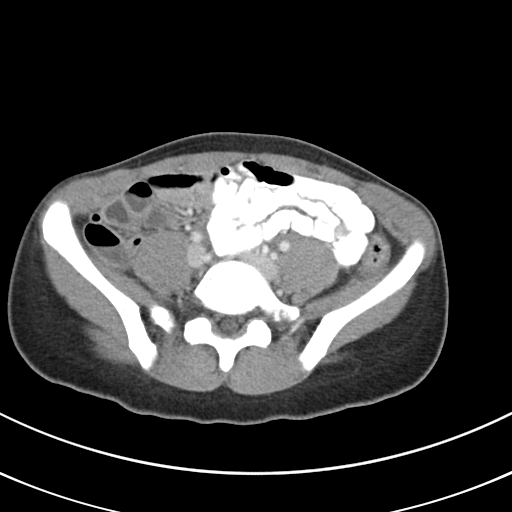
[im 43/86  soft-tissue]
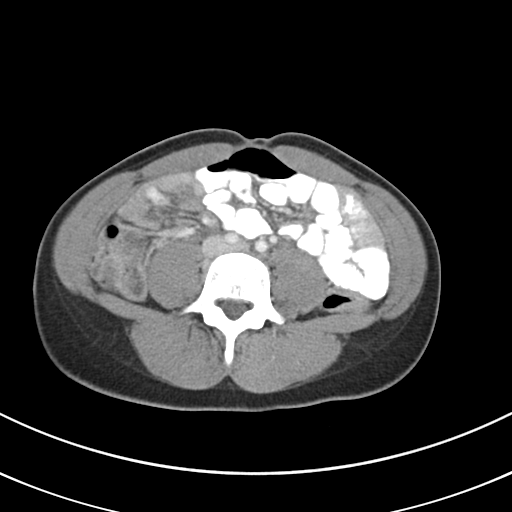
[im 50/86  soft-tissue]
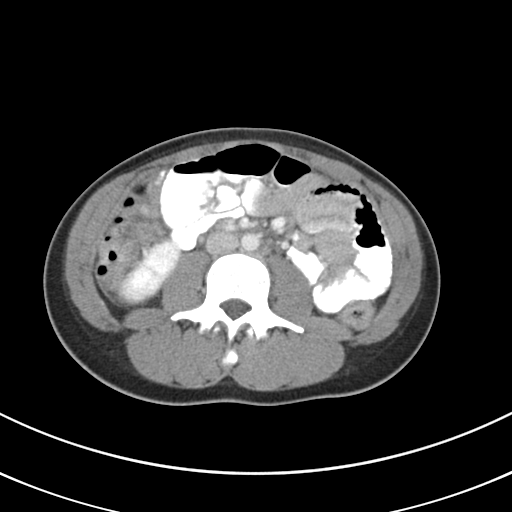
[im 56/86  soft-tissue]
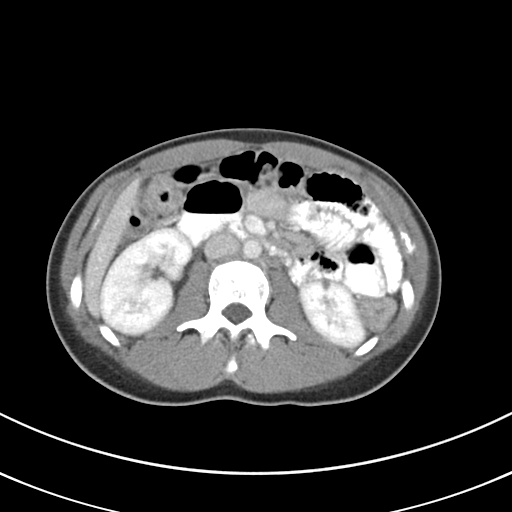
[im 56/86  bone]
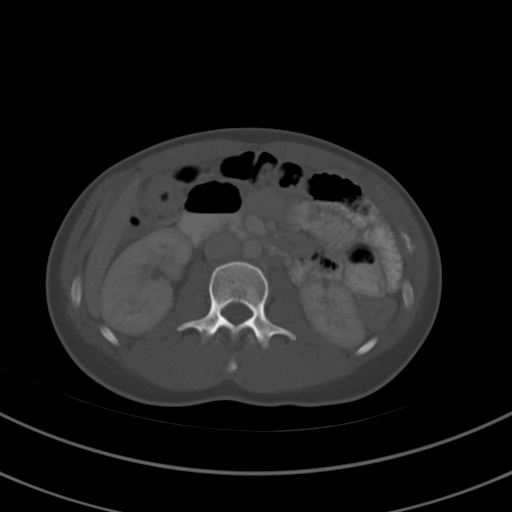
[im 63/86  soft-tissue]
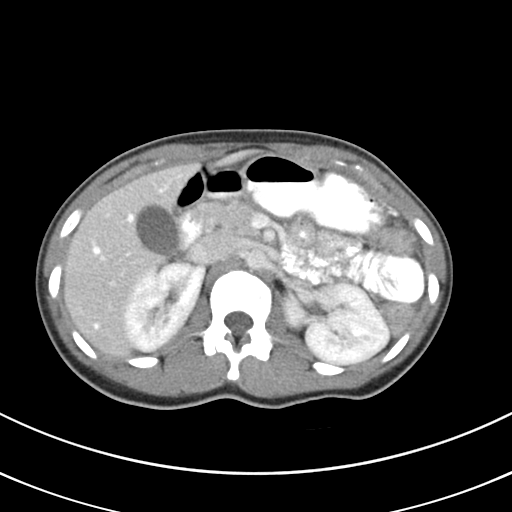
[im 69/86  soft-tissue]
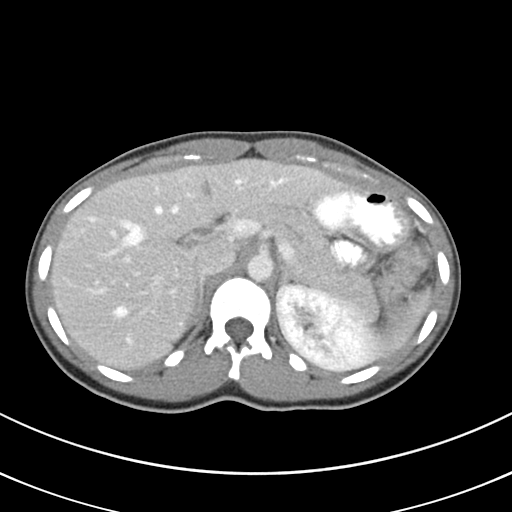
[im 72/86  lung]
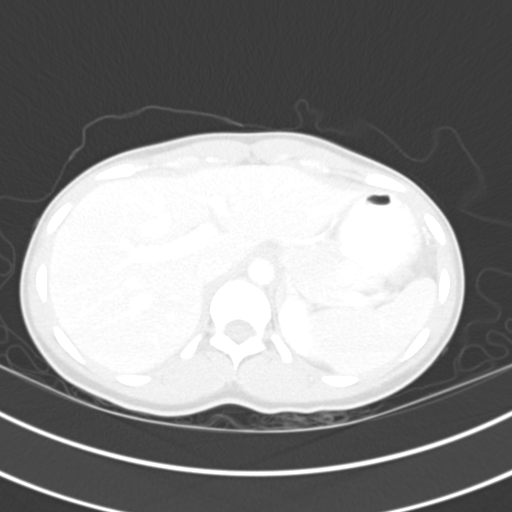
[im 76/86  soft-tissue]
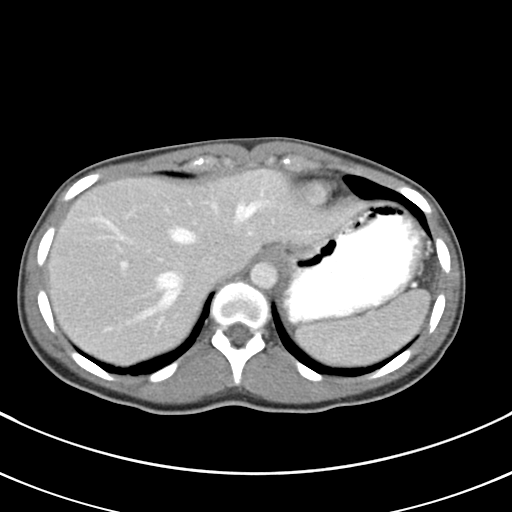
[im 76/86  lung]
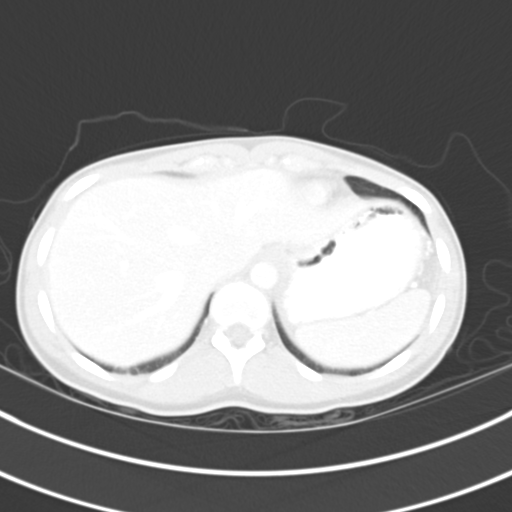
[im 79/86  lung]
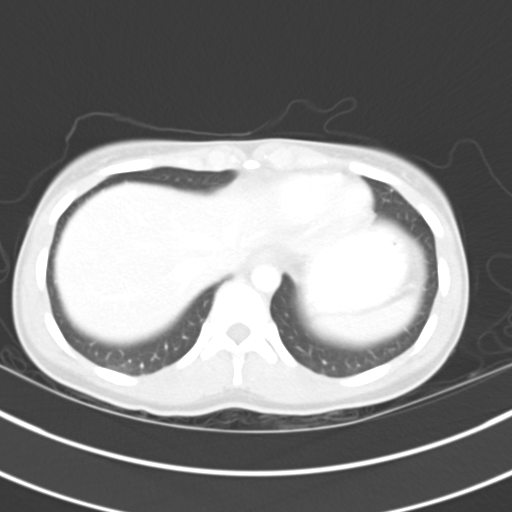
[im 82/86  soft-tissue]
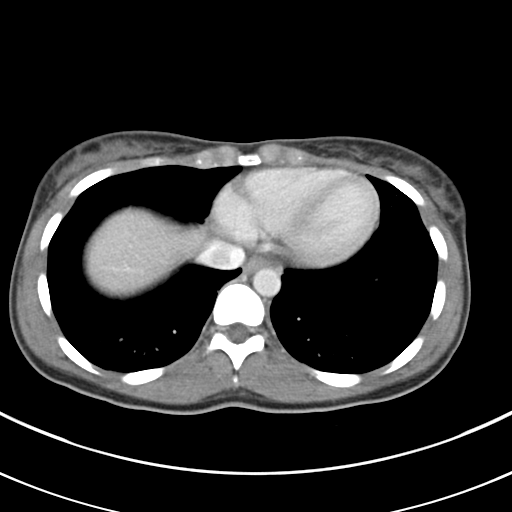
[im 82/86  lung]
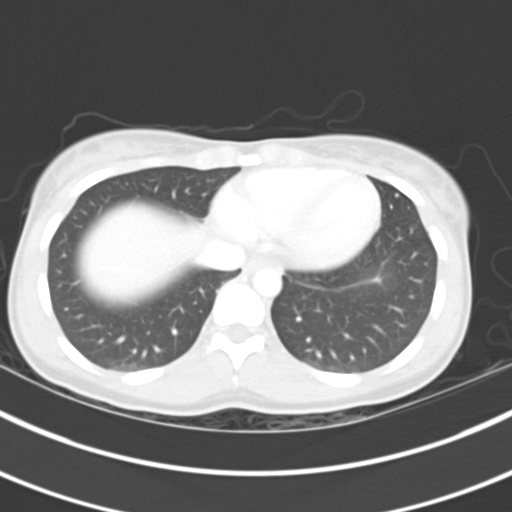

[15 of 32 positions shown; findings below may reference images not displayed]

FINDINGS: Lung bases clear.  Normal heart size.  No pericardial or
pleural effusion.

Abdomen:  Liver demonstrates focal fatty infiltration along the
falciform ligament, image 20.  No other hepatic abnormality.  No
biliary dilatation.  Hepatic and portal veins are patent.
Gallbladder, biliary system, pancreas, spleen, and adrenal glands
within normal limits for age and demonstrate no acute finding.
Kidneys are imaged in the early cortical phase.  Small incidental
cortical cyst noted in the upper pole of the right kidney, sub
centimeter in size.  No hydronephrosis or obstructing urinary tract
calculus.

Negative for bowel obstruction, dilatation, ileus, or free air.
Normal appendix in the right lower quadrant containing air.
Portions of the small bowel and colon are collapsed and non
opacified with contrast.

Pelvis:  Small amount pelvic free fluid, nonspecific.  No distal
obstructing urinary tract calculus or bladder abnormality.  Uterus
is midline.  No adnexal abnormality appreciated.

No acute osseous abnormality.
IMPRESSION: No acute intra-abdominal or pelvic process by CT.

Incidental tiny renal cysts

Normal appendix

Collapsed under distended colon, limited evaluation

Small amount of nonspecific pelvic free fluid.

## 2013-12-03 LAB — OB RESULTS CONSOLE ANTIBODY SCREEN: Antibody Screen: NEGATIVE

## 2013-12-03 LAB — OB RESULTS CONSOLE HEPATITIS B SURFACE ANTIGEN: Hepatitis B Surface Ag: NEGATIVE

## 2013-12-03 LAB — OB RESULTS CONSOLE RUBELLA ANTIBODY, IGM: RUBELLA: IMMUNE

## 2013-12-03 LAB — CYTOLOGY - PAP: PAP SMEAR: NEGATIVE

## 2013-12-03 LAB — OB RESULTS CONSOLE ABO/RH: RH Type: POSITIVE

## 2013-12-06 ENCOUNTER — Encounter (HOSPITAL_COMMUNITY): Payer: Self-pay | Admitting: Emergency Medicine

## 2014-02-03 LAB — OB RESULTS CONSOLE HIV ANTIBODY (ROUTINE TESTING): HIV: NONREACTIVE

## 2014-02-03 LAB — OB RESULTS CONSOLE RPR: RPR: NONREACTIVE

## 2014-03-31 LAB — OB RESULTS CONSOLE GC/CHLAMYDIA
Chlamydia: NEGATIVE
GC PROBE AMP, GENITAL: NEGATIVE

## 2014-03-31 LAB — OB RESULTS CONSOLE GBS: STREP GROUP B AG: NEGATIVE

## 2014-04-20 ENCOUNTER — Encounter (HOSPITAL_COMMUNITY): Payer: Self-pay | Admitting: *Deleted

## 2014-04-20 ENCOUNTER — Inpatient Hospital Stay (HOSPITAL_COMMUNITY)
Admission: AD | Admit: 2014-04-20 | Discharge: 2014-04-22 | DRG: 775 | Disposition: A | Discharge status: Home / Self Care | Payer: Medicaid Other | Source: Ambulatory Visit | Attending: Obstetrics and Gynecology | Admitting: Obstetrics and Gynecology

## 2014-04-20 ENCOUNTER — Inpatient Hospital Stay (HOSPITAL_COMMUNITY): Payer: Medicaid Other | Admitting: Anesthesiology

## 2014-04-20 DIAGNOSIS — Z3A39 39 weeks gestation of pregnancy: Secondary | ICD-10-CM | POA: Diagnosis present

## 2014-04-20 DIAGNOSIS — O9902 Anemia complicating childbirth: Principal | ICD-10-CM | POA: Diagnosis present

## 2014-04-20 DIAGNOSIS — D649 Anemia, unspecified: Secondary | ICD-10-CM | POA: Diagnosis present

## 2014-04-20 DIAGNOSIS — Z3483 Encounter for supervision of other normal pregnancy, third trimester: Secondary | ICD-10-CM | POA: Diagnosis present

## 2014-04-20 DIAGNOSIS — D509 Iron deficiency anemia, unspecified: Secondary | ICD-10-CM

## 2014-04-20 LAB — CBC
HCT: 26.9 % — ABNORMAL LOW (ref 36.0–46.0)
Hemoglobin: 8.6 g/dL — ABNORMAL LOW (ref 12.0–15.0)
MCH: 25.3 pg — ABNORMAL LOW (ref 26.0–34.0)
MCHC: 32 g/dL (ref 30.0–36.0)
MCV: 79.1 fL (ref 78.0–100.0)
Platelets: 265 10*3/uL (ref 150–400)
RBC: 3.4 MIL/uL — ABNORMAL LOW (ref 3.87–5.11)
RDW: 14.9 % (ref 11.5–15.5)
WBC: 7.4 10*3/uL (ref 4.0–10.5)

## 2014-04-20 LAB — TYPE AND SCREEN
ABO/RH(D): O POS
Antibody Screen: NEGATIVE

## 2014-04-20 MED ORDER — OXYCODONE-ACETAMINOPHEN 5-325 MG PO TABS: 1.0000 | ORAL_TABLET | ORAL | Status: DC | PRN

## 2014-04-20 MED ORDER — ONDANSETRON HCL 4 MG PO TABS: 4.0000 mg | ORAL_TABLET | ORAL | Status: DC | PRN

## 2014-04-20 MED ORDER — DIBUCAINE 1 % RE OINT: 1.0000 "application " | TOPICAL_OINTMENT | RECTAL | Status: DC | PRN

## 2014-04-20 MED ORDER — BENZOCAINE-MENTHOL 20-0.5 % EX AERO: 1.0000 "application " | INHALATION_SPRAY | CUTANEOUS | Status: DC | PRN

## 2014-04-20 MED ORDER — WITCH HAZEL-GLYCERIN EX PADS: 1.0000 "application " | MEDICATED_PAD | CUTANEOUS | Status: DC | PRN

## 2014-04-20 MED ORDER — SIMETHICONE 80 MG PO CHEW
80.0000 mg | CHEWABLE_TABLET | ORAL | Status: DC | PRN
Start: 2014-04-20 — End: 2014-04-22

## 2014-04-20 MED ORDER — DIPHENHYDRAMINE HCL 50 MG/ML IJ SOLN: 12.5000 mg | INTRAMUSCULAR | Status: DC | PRN

## 2014-04-20 MED ORDER — OXYCODONE-ACETAMINOPHEN 5-325 MG PO TABS
1.0000 | ORAL_TABLET | ORAL | Status: DC | PRN
  Administered 2014-04-20 – 2014-04-21 (×2): 1 via ORAL
  Filled 2014-04-20 (×2): qty 1

## 2014-04-20 MED ORDER — BENZOCAINE-MENTHOL 20-0.5 % EX AERO
1.0000 "application " | INHALATION_SPRAY | CUTANEOUS | Status: DC | PRN
  Filled 2014-04-20: qty 56

## 2014-04-20 MED ORDER — ACETAMINOPHEN 325 MG PO TABS: 650.0000 mg | ORAL_TABLET | ORAL | Status: DC | PRN

## 2014-04-20 MED ORDER — ZOLPIDEM TARTRATE 5 MG PO TABS: 5.0000 mg | ORAL_TABLET | Freq: Every evening | ORAL | Status: DC | PRN

## 2014-04-20 MED ORDER — FERROUS SULFATE 325 (65 FE) MG PO TABS
ORAL_TABLET | Freq: Two times a day (BID) | ORAL | Status: DC
Start: 2014-04-21 — End: 2014-04-22
  Administered 2014-04-21: 22:00:00 via ORAL
  Administered 2014-04-21 – 2014-04-22 (×2): 325 mg via ORAL
  Filled 2014-04-20 (×3): qty 1

## 2014-04-20 MED ORDER — DIPHENHYDRAMINE HCL 25 MG PO CAPS: 25.0000 mg | ORAL_CAPSULE | Freq: Four times a day (QID) | ORAL | Status: DC | PRN

## 2014-04-20 MED ORDER — ONDANSETRON HCL 4 MG/2ML IJ SOLN: 4.0000 mg | INTRAMUSCULAR | Status: DC | PRN

## 2014-04-20 MED ORDER — LANOLIN HYDROUS EX OINT: TOPICAL_OINTMENT | CUTANEOUS | Status: DC | PRN

## 2014-04-20 MED ORDER — PRENATAL MULTIVITAMIN CH: 1.0000 | ORAL_TABLET | Freq: Every day | ORAL | Status: DC

## 2014-04-20 MED ORDER — OXYCODONE-ACETAMINOPHEN 5-325 MG PO TABS: 2.0000 | ORAL_TABLET | ORAL | Status: DC | PRN

## 2014-04-20 MED ORDER — CITRIC ACID-SODIUM CITRATE 334-500 MG/5ML PO SOLN: 30.0000 mL | ORAL | Status: DC | PRN

## 2014-04-20 MED ORDER — LACTATED RINGERS IV SOLN
INTRAVENOUS | Status: AC
Start: 2014-04-20 — End: 2014-04-21

## 2014-04-20 MED ORDER — LACTATED RINGERS IV SOLN
INTRAVENOUS | Status: DC
  Administered 2014-04-20 (×2): via INTRAVENOUS

## 2014-04-20 MED ORDER — ONDANSETRON HCL 4 MG/2ML IJ SOLN: 4.0000 mg | Freq: Four times a day (QID) | INTRAMUSCULAR | Status: DC | PRN

## 2014-04-20 MED ORDER — SENNOSIDES-DOCUSATE SODIUM 8.6-50 MG PO TABS: 2.0000 | ORAL_TABLET | ORAL | Status: DC

## 2014-04-20 MED ORDER — OXYTOCIN 40 UNITS IN LACTATED RINGERS INFUSION - SIMPLE MED: 62.5000 mL/h | INTRAVENOUS | Status: AC

## 2014-04-20 MED ORDER — FENTANYL 2.5 MCG/ML BUPIVACAINE 1/10 % EPIDURAL INFUSION (WH - ANES)
14.0000 mL/h | INTRAMUSCULAR | Status: DC | PRN
  Administered 2014-04-20: 14 mL/h via EPIDURAL
  Filled 2014-04-20: qty 125

## 2014-04-20 MED ORDER — SENNOSIDES-DOCUSATE SODIUM 8.6-50 MG PO TABS
2.0000 | ORAL_TABLET | ORAL | Status: DC
  Administered 2014-04-20 – 2014-04-21 (×2): 2 via ORAL
  Filled 2014-04-20 (×2): qty 2

## 2014-04-20 MED ORDER — EPHEDRINE 5 MG/ML INJ: 10.0000 mg | INTRAVENOUS | Status: DC | PRN

## 2014-04-20 MED ORDER — OXYTOCIN 40 UNITS IN LACTATED RINGERS INFUSION - SIMPLE MED
1.0000 m[IU]/min | INTRAVENOUS | Status: DC
Start: 2014-04-20 — End: 2014-04-20
  Administered 2014-04-20: 1 m[IU]/min via INTRAVENOUS
  Administered 2014-04-20: 11 m[IU]/min via INTRAVENOUS

## 2014-04-20 MED ORDER — IBUPROFEN 600 MG PO TABS
600.0000 mg | ORAL_TABLET | Freq: Four times a day (QID) | ORAL | Status: DC
  Administered 2014-04-20 – 2014-04-22 (×7): 600 mg via ORAL
  Filled 2014-04-20 (×7): qty 1

## 2014-04-20 MED ORDER — LACTATED RINGERS IV SOLN
500.0000 mL | Freq: Once | INTRAVENOUS | Status: AC
  Administered 2014-04-20: 500 mL via INTRAVENOUS

## 2014-04-20 MED ORDER — TETANUS-DIPHTH-ACELL PERTUSSIS 5-2.5-18.5 LF-MCG/0.5 IM SUSP: 0.5000 mL | Freq: Once | INTRAMUSCULAR | Status: DC

## 2014-04-20 MED ORDER — OXYTOCIN BOLUS FROM INFUSION
500.0000 mL | INTRAVENOUS | Status: DC
  Administered 2014-04-20: 500 mL via INTRAVENOUS

## 2014-04-20 MED ORDER — PHENYLEPHRINE 40 MCG/ML (10ML) SYRINGE FOR IV PUSH (FOR BLOOD PRESSURE SUPPORT)
80.0000 ug | PREFILLED_SYRINGE | INTRAVENOUS | Status: DC | PRN
  Filled 2014-04-20: qty 20

## 2014-04-20 MED ORDER — FLEET ENEMA 7-19 GM/118ML RE ENEM: 1.0000 | ENEMA | RECTAL | Status: DC | PRN

## 2014-04-20 MED ORDER — LACTATED RINGERS IV SOLN: INTRAVENOUS | Status: DC

## 2014-04-20 MED ORDER — OXYTOCIN 40 UNITS IN LACTATED RINGERS INFUSION - SIMPLE MED: 62.5000 mL/h | INTRAVENOUS | Status: DC

## 2014-04-20 MED ORDER — TERBUTALINE SULFATE 1 MG/ML IJ SOLN: 0.2500 mg | Freq: Once | INTRAMUSCULAR | Status: DC | PRN

## 2014-04-20 MED ORDER — PRENATAL MULTIVITAMIN CH
1.0000 | ORAL_TABLET | Freq: Every day | ORAL | Status: DC
  Administered 2014-04-21 – 2014-04-22 (×2): 1 via ORAL
  Filled 2014-04-20 (×2): qty 1

## 2014-04-20 MED ORDER — MEASLES, MUMPS & RUBELLA VAC ~~LOC~~ INJ: 0.5000 mL | INJECTION | Freq: Once | SUBCUTANEOUS | Status: DC

## 2014-04-20 MED ORDER — FENTANYL 2.5 MCG/ML BUPIVACAINE 1/10 % EPIDURAL INFUSION (WH - ANES)
INTRAMUSCULAR | Status: DC | PRN
  Administered 2014-04-20: 14 mL/h via EPIDURAL

## 2014-04-20 MED ORDER — PHENYLEPHRINE 40 MCG/ML (10ML) SYRINGE FOR IV PUSH (FOR BLOOD PRESSURE SUPPORT): 80.0000 ug | PREFILLED_SYRINGE | INTRAVENOUS | Status: DC | PRN

## 2014-04-20 MED ORDER — LIDOCAINE HCL (PF) 1 % IJ SOLN
30.0000 mL | INTRAMUSCULAR | Status: DC | PRN
  Filled 2014-04-20: qty 30

## 2014-04-20 MED ORDER — SIMETHICONE 80 MG PO CHEW: 80.0000 mg | CHEWABLE_TABLET | ORAL | Status: DC | PRN

## 2014-04-20 MED ORDER — IBUPROFEN 600 MG PO TABS
600.0000 mg | ORAL_TABLET | Freq: Four times a day (QID) | ORAL | Status: DC
Start: 2014-04-21 — End: 2014-04-20

## 2014-04-20 MED ORDER — LIDOCAINE HCL (PF) 1 % IJ SOLN
INTRAMUSCULAR | Status: DC | PRN
  Administered 2014-04-20 (×2): 5 mL

## 2014-04-20 MED ORDER — OXYTOCIN 40 UNITS IN LACTATED RINGERS INFUSION - SIMPLE MED
62.5000 mL/h | INTRAVENOUS | Status: DC
Start: 2014-04-20 — End: 2014-04-20
  Filled 2014-04-20: qty 1000

## 2014-04-20 MED ORDER — LACTATED RINGERS IV SOLN: 500.0000 mL | INTRAVENOUS | Status: DC | PRN

## 2014-04-20 NOTE — Progress Notes (Signed)
Patient ID: [name] M Packett, female   DOB: 1990/02/15, 24 y.o.   MRN: [id number] Pt is on pitocin and has her epidural. The contractions are q 3 minutes and the cervix is 4 cm 80% effaced and the vertex is at - 2 station. AROM produced clear fluid.

## 2014-04-20 NOTE — Progress Notes (Signed)
Patient ID: [name], female   DOB: [birth year], 24 y.o.   MRN: [id number] Pt has become more uncomfortable since AROM. The contractions are q 2-3 minutes. The cervix is 8 cm 100% effaced and the vertex is at 0 station.

## 2014-04-20 NOTE — H&P (Signed)
NAMLaurette [name]:  [name], [name]                ACCOUNT NO.:  [id number]  MEDICAL RECORD NO.:  [id number]  LOCATION:                                 FACILITY:  PHYSICIAN:  Malachi Prohomas F. Ambrose MantleHenley, M.D.      DATE OF BIRTH:  DATE OF ADMISSION:  04/20/2014 DATE OF DISCHARGE:                             HISTORY & PHYSICAL   HISTORY OF PRESENT ILLNESS:  This is a 24 year old black female, para 2- 0-0-2 gravida 3, EDC April 25, 2014, admitted for induction of labor. Blood group and type; O positive, negative antibody.  Hemoglobin was low on prenatal visit.  RPR negative. Urine culture negative.  Hepatitis B surface antigen negative.  HIV negative.  GC and Chlamydia negative. Varicella immune.  Rubella immune.  Hemoglobin AA.  Pap smear normal. Genetic screening tests normal, 1-hour Glucola 100, group B strep negative, repeat HIV, RPR.  GC and Chlamydia negative.  The patient began her prenatal course at 18 weeks.  The patient reported both deliveries at 39 weeks in the past.  Her prenatal course was uncomplicated.  She stated that she might want her tubes tied.  I encouraged her to stop short of sterilization, but if she wanted sterilization, she could sign her papers in 30-180 days prior to the procedure.  She took ferrous sulfate twice a day.  She began her Valtrex on March 31, 2014.  Her cervix has remained 3 cm 30% for the last 2 weeks.  She is admitted now for induction of labor.  PAST MEDICAL HISTORY:  Reveals a history of asthma.  She has had a T and A done at age 89.  ALLERGIES:  She has no known drug allergies.  No latex allergy.  FAMILY HISTORY:  Mother with diabetes mellitus, insulin dependent. Mother died of cancer of the lung at age 23.  Paternal grandmother has diabetes.  SOCIAL HISTORY:  The patient never smoked.  Does not drink.  Does not use illicit drugs.  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  PHYSICAL EXAMINATION:  GENERAL:  On admission, a well-developed  black female, in no distress. VITAL SIGNS:  Blood pressure 100/68, weight 136, pulse 80. HEART:  Normal size and sounds.  No murmurs. LUNGS:  Clear to auscultation. ABDOMEN:  Fundal height 39 cm.  Fetal heart tones normal.  Cervix 3 cm, 30%, vertex -3.  ADMITTING IMPRESSION:  Intrauterine pregnancy at 39 weeks and 2 days. She is admitted for induction of labor.    Malachi Prohomas F. Ambrose MantleHenley, M.D.    TFH/MEDQ  D:  04/19/2014  T:  04/19/2014  Job:  [id number]

## 2014-04-20 NOTE — Anesthesia Preprocedure Evaluation (Signed)
Anesthesia Evaluation  Patient identified by MRN, date of birth, ID band Patient awake    Reviewed: Allergy & Precautions, H&P , Patient's Chart, lab work & pertinent test results  Airway Mallampati: II  TM Distance: >3 FB Neck ROM: full    Dental   Pulmonary asthma ,  breath sounds clear to auscultation        Cardiovascular Rhythm:regular Rate:Normal     Neuro/Psych    GI/Hepatic   Endo/Other    Renal/GU      Musculoskeletal   Abdominal   Peds  Hematology   Anesthesia Other Findings   Reproductive/Obstetrics (+) Pregnancy                             Anesthesia Physical Anesthesia Plan  ASA: II  Anesthesia Plan: Epidural   Post-op Pain Management:    Induction:   Airway Management Planned:   Additional Equipment:   Intra-op Plan:   Post-operative Plan:   Informed Consent: I have reviewed the patients History and Physical, chart, labs and discussed the procedure including the risks, benefits and alternatives for the proposed anesthesia with the patient or authorized representative who has indicated his/her understanding and acceptance.     Plan Discussed with:   Anesthesia Plan Comments:         Anesthesia Quick Evaluation  

## 2014-04-20 NOTE — Plan of Care (Signed)
Problem: Consults Goal: Birthing Suites Patient Information Press F2 to bring up selections list Outcome: Completed/Met Date Met:  04/20/14  Pt 37-[redacted] weeks EGA

## 2014-04-20 NOTE — Progress Notes (Signed)
Patient ID: [name], female   DOB: 02-24-1990, 24 y.o.   MRN: [id number] Delivery note:  She progressed rapidly to full dilatation and pushed well to deliver a living female infant spontaneously ROA over an intact perineum. Apgars were 8 and 9 at 1 and 5 minutes. The placenta delivered intact and the uterus was normal. There was a left labial laceration that was repaired with 3-0 vicryl.EBL 400 cc's.

## 2014-04-20 NOTE — Anesthesia Procedure Notes (Signed)
Epidural Patient location during procedure: OB Start time: 04/20/2014 4:09 PM  Staffing Anesthesiologist: Brayton CavesJACKSON, Teshawn Moan Performed by: anesthesiologist   Preanesthetic Checklist Completed: patient identified, site marked, surgical consent, pre-op evaluation, timeout performed, IV checked, risks and benefits discussed and monitors and equipment checked  Epidural Patient position: sitting Prep: site prepped and draped and DuraPrep Patient monitoring: continuous pulse ox and blood pressure Approach: midline Location: L3-L4 Injection technique: LOR air  Needle:  Needle type: Tuohy  Needle gauge: 17 G Needle length: 9 cm and 9 Needle insertion depth: 5 cm cm Catheter type: closed end flexible Catheter size: 19 Gauge Catheter at skin depth: 10 cm Test dose: negative  Assessment Events: blood not aspirated, injection not painful, no injection resistance, negative IV test and no paresthesia  Additional Notes Patient identified.  Risk benefits discussed including failed block, incomplete pain control, headache, nerve damage, paralysis, blood pressure changes, nausea, vomiting, reactions to medication both toxic or allergic, and postpartum back pain.  Patient expressed understanding and wished to proceed.  All questions were answered.  Sterile technique used throughout procedure and epidural site dressed with sterile barrier dressing. No paresthesia or other complications noted.The patient did not experience any signs of intravascular injection such as tinnitus or metallic taste in mouth nor signs of intrathecal spread such as rapid motor block. Please see nursing notes for vital signs.

## 2014-04-20 NOTE — Progress Notes (Signed)
Patient ID: [name], female   DOB: 11-04-1990, 24 y.o.   MRN: [id number] The pt is on 9 mu/minute of pitocin. Contractions are not painful and q 2-5 minutes. Cervix is 3 cm 50% effaced and the vertex is at - 3 station.

## 2014-04-21 ENCOUNTER — Encounter (HOSPITAL_COMMUNITY): Payer: Self-pay

## 2014-04-21 LAB — CBC
HEMATOCRIT: 27 % — AB (ref 36.0–46.0)
Hemoglobin: 8.5 g/dL — ABNORMAL LOW (ref 12.0–15.0)
MCH: 25.1 pg — AB (ref 26.0–34.0)
MCHC: 31.5 g/dL (ref 30.0–36.0)
MCV: 79.6 fL (ref 78.0–100.0)
PLATELETS: 273 10*3/uL (ref 150–400)
RBC: 3.39 MIL/uL — AB (ref 3.87–5.11)
RDW: 14.9 % (ref 11.5–15.5)
WBC: 12.4 10*3/uL — AB (ref 4.0–10.5)

## 2014-04-21 LAB — RPR: RPR: NONREACTIVE

## 2014-04-21 NOTE — Lactation Note (Signed)
This note was copied from the chart of Loretta Laurette SchimkeMiranda Gulbranson. Lactation Consultation Note Mom BF her 1st child for 2 months, stopped d/t didn't feel like he was getting enough. BF her 2nd child 2 weeks and stopped d/t engorgement. (Mom stated she went outside before she was suppose to and her breast became hard as rocks. Stated she wasn't suppose to go outside for 40 days per her mother.) explained that was normal, her milk was full in her breast, she needed to BF her baby. Discussed engorgement and importance of BF every three hours or sooner if cued by baby.  Mom has everted nipples, hand expression taught and demonstrated to mom her colostrum. Encouraged to rub on nipples to prevent soreness.  Mom encouraged to feed baby 8-12 times/24 hours and with feeding cues. Mom encouraged to waken baby for feeds.  Educated about newborn behavior. Referred to Baby and Me Book in Breastfeeding section Pg. 22-23 for position options and Proper latch demonstration. Pointed out section on engorgement and prevention and management. [facility] brochure given w/resources, support groups and LC services. Mom encouraged to do skin-to-skin. Patient Name: [name] Reason for consult: Initial assessment   Maternal Data Has patient been taught Hand Expression?: Yes Does the patient have breastfeeding experience prior to this delivery?: Yes  Feeding    LATCH Score/Interventions       Type of Nipple: Everted at rest and after stimulation  Comfort (Breast/Nipple): Soft / non-tender           Lactation Tools Discussed/Used     Consult Status Consult Status: Follow-up Date: 04/22/14 Follow-up type: In-patient    Charyl DancerCARVER, Kristeena Meineke G [name], 5:23 AM

## 2014-04-21 NOTE — Lactation Note (Signed)
This note was copied from the chart of [name] Oh. Lactation Consultation Note; Mother states that infant is feeding very frequently. Discussed cluster feeding. Advised mother to continue to feed infant 8-12 times in 24 hours. Infant is asleep in fathers arms. Advised of watching for cues. Mother is eating lunch. Suggested that mother continue to hand express and may offer EBM with a spoon after feeding as needed. Mother to page for next feeding to be observed. Mother denies painful latch. States that infant is tugging with strong tug.   Patient Name: [name] Tammen RUEAV'WToday's Date: 04/21/2014 Reason for consult: Follow-up assessment   Maternal Data    Feeding Feeding Type: Breast Fed  LATCH Score/Interventions                      Lactation Tools Discussed/Used     Consult Status Consult Status: Follow-up Date: 04/21/14 Follow-up type: In-patient    Stevan BornKendrick, [facility] 04/21/2014, 1:43 PM

## 2014-04-21 NOTE — Anesthesia Postprocedure Evaluation (Signed)
Anesthesia Post Note  Patient: [name]  Procedure(s) Performed: * No procedures listed *  Anesthesia type: Epidural  Patient location: Mother/Baby  Post pain: Pain level controlled  Post assessment: Post-op Vital signs reviewed  Last Vitals:  Filed Vitals:   04/21/14 0800  BP: 110/66  Pulse: 63  Temp: 36.3 C  Resp: 18    Post vital signs: Reviewed  Level of consciousness:alert  Complications: No apparent anesthesia complications

## 2014-04-22 MED ORDER — OXYCODONE-ACETAMINOPHEN 5-325 MG PO TABS
1.0000 | ORAL_TABLET | Freq: Four times a day (QID) | ORAL | Status: DC | PRN
Start: 2014-04-22 — End: 2014-12-11

## 2014-04-22 MED ORDER — FERROUS SULFATE 325 (65 FE) MG PO TABS: 1.0000 mg | ORAL_TABLET | Freq: Two times a day (BID) | ORAL | Status: DC

## 2014-04-22 MED ORDER — IBUPROFEN 600 MG PO TABS: 600.0000 mg | ORAL_TABLET | Freq: Four times a day (QID) | ORAL | Status: DC | PRN

## 2014-04-22 NOTE — Lactation Note (Signed)
This note was copied from the chart of [name]. Lactation Consultation Note: Mother states that infant is continuing to cluster feed. She is hearing infant swallow. She states that this is encouraging. Mother has a hand pump for use at home if needed. Mother states on slight discomfort on the tip of her nipple on initial latch. No bruising or trama to nipples. Mothers breast are filling.  Reviewed treatment plan to prevent severe engorgement. Mother was advised to continue to cue base feed and to feed at least 8-12 times in 24 hours. Mother is aware of all available LC sources. She has a [facility] appt on Monday.  Patient Name: [name]'s Date: 04/22/2014 Reason for consult: Follow-up assessment   Maternal Data    Feeding Feeding Type: Breast Fed Length of feed: 45 min  LATCH Score/Interventions                      Lactation Tools Discussed/Used     Consult Status Consult Status: Complete    [name], [name] 04/22/2014, 10:55 AM

## 2014-04-22 NOTE — Progress Notes (Signed)
Patient ID: [name], female   DOB: 07-28-1990, 24 y.o.   MRN: [id number] #2 afebrile BP normal for d/c

## 2014-04-22 NOTE — Discharge Summary (Signed)
NAMLaurette Schimke:  [name], Dewana                ACCOUNT NO.:  [id number]  MEDICAL RECORD NO.:  [id number]  LOCATION:  9135                          FACILITY:  [facility]  PHYSICIAN:  Malachi Prohomas F. Ambrose MantleHenley, M.D. DATE OF BIRTH:  1991/01/23  DATE OF ADMISSION:  04/20/2014 DATE OF DISCHARGE:  04/22/2014                              DISCHARGE SUMMARY   HOSPITAL COURSE:  This is a 24 year old black female, para 2-0-0-2, gravida 3, EDC April 25, 2014, admitted for induction of labor.  The patient was placed on Pitocin.  Cervix was 3 cm dilated.  She received an epidural.  By 05:41 p.m., the contractions were every 3 minutes. Cervix was 4 cm, 80%.  Artificial rupture of the membranes produced clear fluid.  By 06:32 p.m., the cervix was 8 cm.  She progressed rapidly to full dilatation, pushed well and delivered a living female infant spontaneously ROA over an intact perineum by Dr. Ambrose MantleHenley. Placenta delivered intact.  Uterus was normal.  There was a left labial laceration that was repaired with 3-0 Vicryl.  Blood loss about 400 mL. Postpartum, the patient did well and was discharged on the second postpartum day.  Initial hemoglobin was 8.6, hematocrit 26.9, white count 7400, platelet count 265,000, followup hemoglobin was 8.5.  RPR was nonreactive.  FINAL DIAGNOSES:  Intrauterine pregnancy, 39 weeks, delivered vertex.  OPERATION:  Spontaneous delivery vertex.  ADDITIONAL DIAGNOSIS:  Anemia.  DISCHARGE INSTRUCTIONS:  Discharge instructions include our regular discharge instruction booklet as well as the after visit summary; prescription for ferrous sulfate 325 mg twice daily 60 tablets 3 refills; Percocet 5/325, 30 tablets, 1 every 6 hours as needed for pain; and Motrin 600 mg 30 tablets, 1 every 6 hours as needed for pain.  She is to return to the office in 6 weeks for followup examination.     Malachi Prohomas F. Ambrose MantleHenley, M.D.     TFH/MEDQ  D:  04/22/2014  T:  04/22/2014  Job:  [id number]

## 2014-04-22 NOTE — Discharge Instructions (Signed)
booklet °

## 2014-05-11 NOTE — Addendum Note (Signed)
Addendum  created 05/11/14 1218 by Brayton CavesFreeman Rogan Wigley, MD   Modules edited: Anesthesia Responsible Staff

## 2014-12-11 ENCOUNTER — Encounter (HOSPITAL_COMMUNITY): Payer: Self-pay | Admitting: *Deleted

## 2014-12-11 ENCOUNTER — Emergency Department (HOSPITAL_COMMUNITY)
Admission: EM | Admit: 2014-12-11 | Discharge: 2014-12-11 | Disposition: A | Discharge status: Home / Self Care | Payer: Medicaid Other | Attending: Physician Assistant | Admitting: Physician Assistant

## 2014-12-11 DIAGNOSIS — A084 Viral intestinal infection, unspecified: Secondary | ICD-10-CM | POA: Insufficient documentation

## 2014-12-11 DIAGNOSIS — Z3202 Encounter for pregnancy test, result negative: Secondary | ICD-10-CM | POA: Insufficient documentation

## 2014-12-11 DIAGNOSIS — J45909 Unspecified asthma, uncomplicated: Secondary | ICD-10-CM | POA: Insufficient documentation

## 2014-12-11 LAB — URINE MICROSCOPIC-ADD ON

## 2014-12-11 LAB — CBC
HEMATOCRIT: 33 % — AB (ref 36.0–46.0)
HEMOGLOBIN: 10.9 g/dL — AB (ref 12.0–15.0)
MCH: 28.9 pg (ref 26.0–34.0)
MCHC: 33 g/dL (ref 30.0–36.0)
MCV: 87.5 fL (ref 78.0–100.0)
Platelets: 307 10*3/uL (ref 150–400)
RBC: 3.77 MIL/uL — ABNORMAL LOW (ref 3.87–5.11)
RDW: 13.4 % (ref 11.5–15.5)
WBC: 5.6 10*3/uL (ref 4.0–10.5)

## 2014-12-11 LAB — URINALYSIS, ROUTINE W REFLEX MICROSCOPIC
BILIRUBIN URINE: NEGATIVE
Glucose, UA: NEGATIVE mg/dL
Ketones, ur: NEGATIVE mg/dL
Leukocytes, UA: NEGATIVE
Nitrite: NEGATIVE
PH: 6 (ref 5.0–8.0)
Protein, ur: NEGATIVE mg/dL
SPECIFIC GRAVITY, URINE: 1.027 (ref 1.005–1.030)
Urobilinogen, UA: 0.2 mg/dL (ref 0.0–1.0)

## 2014-12-11 LAB — COMPREHENSIVE METABOLIC PANEL
ALT: 10 U/L — ABNORMAL LOW (ref 14–54)
ANION GAP: 5 (ref 5–15)
AST: 16 U/L (ref 15–41)
Albumin: 4.2 g/dL (ref 3.5–5.0)
Alkaline Phosphatase: 38 U/L (ref 38–126)
BUN: 9 mg/dL (ref 6–20)
CHLORIDE: 108 mmol/L (ref 101–111)
CO2: 28 mmol/L (ref 22–32)
Calcium: 9.5 mg/dL (ref 8.9–10.3)
Creatinine, Ser: 0.68 mg/dL (ref 0.44–1.00)
GFR calc Af Amer: >60 mL/min (ref >60)
GFR calc non Af Amer: >60 mL/min (ref >60)
GLUCOSE: 87 mg/dL (ref 65–99)
POTASSIUM: 3.5 mmol/L (ref 3.5–5.1)
SODIUM: 141 mmol/L (ref 135–145)
Total Bilirubin: 0.6 mg/dL (ref 0.3–1.2)
Total Protein: 7 g/dL (ref 6.5–8.1)

## 2014-12-11 LAB — HCG, QUANTITATIVE, PREGNANCY: hCG, Beta Chain, Quant, S: <1 m[IU]/mL (ref <5)

## 2014-12-11 LAB — LIPASE, BLOOD: Lipase: 28 U/L (ref 11–51)

## 2014-12-11 LAB — POC URINE PREG, ED: Preg Test, Ur: NEGATIVE

## 2014-12-11 MED ORDER — SODIUM CHLORIDE 0.9 % IV BOLUS (SEPSIS)
1000.0000 mL | Freq: Once | INTRAVENOUS | Status: AC
  Administered 2014-12-11: 1000 mL via INTRAVENOUS

## 2014-12-11 MED ORDER — ONDANSETRON 4 MG PO TBDP
4.0000 mg | ORAL_TABLET | Freq: Once | ORAL | Status: DC | PRN
  Filled 2014-12-11: qty 1

## 2014-12-11 MED ORDER — ONDANSETRON HCL 4 MG/2ML IJ SOLN
4.0000 mg | Freq: Once | INTRAMUSCULAR | Status: AC
  Administered 2014-12-11: 4 mg via INTRAVENOUS
  Filled 2014-12-11: qty 2

## 2014-12-11 MED ORDER — ONDANSETRON HCL 4 MG PO TABS: 4.0000 mg | ORAL_TABLET | Freq: Three times a day (TID) | ORAL | Status: DC | PRN

## 2014-12-11 NOTE — Discharge Instructions (Signed)

## 2014-12-11 NOTE — ED Notes (Signed)
Crackers & gingerale provided

## 2014-12-11 NOTE — ED Provider Notes (Signed)
CSN: [id number]     Arrival date & time 12/11/14  2122 History   First MD Initiated Contact with Patient 12/11/14 2133     Chief Complaint  Patient presents with  . Emesis  . Diarrhea     (Consider location/radiation/quality/duration/timing/severity/associated sxs/prior Treatment) HPI   Patient is a 24 year old female who works at [employer]. Patient reports that she had a coworker that was sick 2 days ago. She reports today she had both vomiting and diarrea that started today.she had a fever. No urinary symptoms. She reports she is not pregnant.   Past Medical History  Diagnosis Date  . Asthma     as a child  . Herpes    Past Surgical History  Procedure Laterality Date  . Tonsillectomy    . Wisdom tooth extraction     Family History  Problem Relation Age of Onset  . Asthma Mother   . Alcohol abuse Mother   . Hypertension Father   . Seizures Son     febrile  . Diabetes Maternal Grandmother    Social History  Substance Use Topics  . Smoking status: Never Smoker   . Smokeless tobacco: None  . Alcohol Use: No   OB History    Gravida Para Term Preterm AB TAB SAB Ectopic Multiple Living   0 3     Review of Systems  Constitutional: Negative for activity change.  Respiratory: Negative for shortness of breath.   Cardiovascular: Negative for chest pain.  Gastrointestinal: Positive for nausea, vomiting and diarrhea. Negative for abdominal pain.      Allergies  Review of patient's allergies indicates no known allergies.  Home Medications   Prior to Admission medications   Medication Sig Start Date End Date Taking? Authorizing Provider  acetaminophen (TYLENOL) 500 MG tablet Take 1,000 mg by mouth every 6 (six) hours as needed (for pain.).   Yes Historical Provider, MD  ferrous sulfate 325 (65 FE) MG tablet Take 1 tablet (325 mg total) by mouth 2 (two) times daily. Patient not taking: Reported on 12/11/2014 04/22/14   Tracey Harries, MD    BP 119/71 mmHg  Pulse 99  Temp(Src) 98.4 F (36.9 C) (Oral)  Resp 19  Ht  (1.676 m)  Wt 106 lb (48.081 kg)  BMI 17.12 kg/m2  SpO2 100%  LMP 12/04/2014 Physical Exam  Constitutional: She is oriented to person, place, and time. She appears well-developed and well-nourished.  HENT:  Head: Normocephalic and atraumatic.  Eyes: Conjunctivae are normal. Right eye exhibits no discharge.  Neck: Neck supple.  Cardiovascular: Normal rate, regular rhythm and normal heart sounds.   No murmur heard. Pulmonary/Chest: Effort normal and breath sounds normal. She has no wheezes. She has no rales.  Abdominal: Soft. She exhibits no distension. There is no tenderness.  Musculoskeletal: Normal range of motion. She exhibits no edema.  Neurological: She is oriented to person, place, and time. No cranial nerve deficit.  Skin: Skin is warm and dry. No rash noted. She is not diaphoretic.  Psychiatric: She has a normal mood and affect. Her behavior is normal.  Nursing note and vitals reviewed.   ED Course  Procedures (including critical care time) Labs Review Labs Reviewed  LIPASE, BLOOD  COMPREHENSIVE METABOLIC PANEL  CBC  URINALYSIS, ROUTINE W REFLEX MICROSCOPIC (NOT AT [facility])  HCG, QUANTITATIVE, PREGNANCY  POC URINE PREG, ED    Imaging Review No results found. I have personally reviewed and  evaluated these images and lab results as part of my medical decision-making.   EKG Interpretation None      MDM   Final diagnoses:  None    Patient is a well-appearing 24 year old female presenting with nausea vomiting diarrhea. Patient had a sick contact with the same thing days ago. I suspect it is viral gastroenteritis. Patient has no abdominal tenderness.  Therefor I do not suspect any intra-abdominal pathology.She has normal vital signs. We'll give her fluids and Zofran. Anticipate ability to discharge home with a work note.    Retha Bither Randall AnLyn Kvion Shapley, MD 12/11/14 2210

## 2014-12-11 NOTE — ED Notes (Signed)
Pt states that she began having N/V/D today; pt states that she has vomited x 3 today; diarrhea x 3 today; pt c.o abd cramping with with N/V/D; pt states that she recently started eating her steaks medium instead of well done and thinks that this may be the cause

## 2014-12-11 NOTE — ED Notes (Signed)
Pt ambulated to the restroom in attempt to collect urine sample.  

## 2015-05-24 ENCOUNTER — Ambulatory Visit (HOSPITAL_COMMUNITY)
Admission: EM | Admit: 2015-05-24 | Discharge: 2015-05-24 | Disposition: A | Discharge status: Home / Self Care | Payer: Self-pay | Attending: Family Medicine | Admitting: Family Medicine

## 2015-05-24 ENCOUNTER — Encounter (HOSPITAL_COMMUNITY): Payer: Self-pay | Admitting: Emergency Medicine

## 2015-05-24 DIAGNOSIS — L0231 Cutaneous abscess of buttock: Secondary | ICD-10-CM

## 2015-05-24 MED ORDER — HYDROCODONE-ACETAMINOPHEN 5-325 MG PO TABS: 1.0000 | ORAL_TABLET | ORAL | Status: DC | PRN

## 2015-05-24 MED ORDER — LIDOCAINE-EPINEPHRINE (PF) 2 %-1:200000 IJ SOLN
INTRAMUSCULAR | Status: AC
  Filled 2015-05-24: qty 20

## 2015-05-24 NOTE — Discharge Instructions (Signed)
Abscess Warm compresses. Keep dry Return 2 days. An abscess is an infected area that contains a collection of pus and debris.It can occur in almost any part of the body. An abscess is also known as a furuncle or boil. CAUSES  An abscess occurs when tissue gets infected. This can occur from blockage of oil or sweat glands, infection of hair follicles, or a minor injury to the skin. As the body tries to fight the infection, pus collects in the area and creates pressure under the skin. This pressure causes pain. People with weakened immune systems have difficulty fighting infections and get certain abscesses more often.  SYMPTOMS Usually an abscess develops on the skin and becomes a painful mass that is red, warm, and tender. If the abscess forms under the skin, you may feel a moveable soft area under the skin. Some abscesses break open (rupture) on their own, but most will continue to get worse without care. The infection can spread deeper into the body and eventually into the bloodstream, causing you to feel ill.  DIAGNOSIS  Your caregiver will take your medical history and perform a physical exam. A sample of fluid may also be taken from the abscess to determine what is causing your infection. TREATMENT  Your caregiver may prescribe antibiotic medicines to fight the infection. However, taking antibiotics alone usually does not cure an abscess. Your caregiver may need to make a small cut (incision) in the abscess to drain the pus. In some cases, gauze is packed into the abscess to reduce pain and to continue draining the area. HOME CARE INSTRUCTIONS   Only take over-the-counter or prescription medicines for pain, discomfort, or fever as directed by your caregiver.  If you were prescribed antibiotics, take them as directed. Finish them even if you start to feel better.  If gauze is used, follow your caregiver's directions for changing the gauze.  To avoid spreading the infection:  Keep your  draining abscess covered with a bandage.  Wash your hands well.  Do not share personal care items, towels, or whirlpools with others.  Avoid skin contact with others.  Keep your skin and clothes clean around the abscess.  Keep all follow-up appointments as directed by your caregiver. SEEK MEDICAL CARE IF:   You have increased pain, swelling, redness, fluid drainage, or bleeding.  You have muscle aches, chills, or a general ill feeling.  You have a fever. MAKE SURE YOU:   Understand these instructions.  Will watch your condition.  Will get help right away if you are not doing well or get worse.   This information is not intended to replace advice given to you by your health care provider. Make sure you discuss any questions you have with your health care provider.   Document Released: 10/31/2004 Document Revised: 07/23/2011 Document Reviewed: 04/05/2011 Elsevier Interactive Patient Education 2016 Elsevier Inc.  Incision and Drainage Incision and drainage is a procedure in which a sac-like structure (cystic structure) is opened and drained. The area to be drained usually contains material such as pus, fluid, or blood.  LET YOUR CAREGIVER KNOW ABOUT:   Allergies to medicine.  Medicines taken, including vitamins, herbs, eyedrops, over-the-counter medicines, and creams.  Use of steroids (by mouth or creams).  Previous problems with anesthetics or numbing medicines.  History of bleeding problems or blood clots.  Previous surgery.  Other health problems, including diabetes and kidney problems.  Possibility of pregnancy, if this applies. RISKS AND COMPLICATIONS  Pain.  Bleeding.  Scarring.  Infection. BEFORE THE PROCEDURE  You may need to have an ultrasound or other imaging tests to see how large or deep your cystic structure is. Blood tests may also be used to determine if you have an infection or how severe the infection is. You may need to have a tetanus  shot. PROCEDURE  The affected area is cleaned with a cleaning fluid. The cyst area will then be numbed with a medicine (local anesthetic). A small incision will be made in the cystic structure. A syringe or catheter may be used to drain the contents of the cystic structure, or the contents may be squeezed out. The area will then be flushed with a cleansing solution. After cleansing the area, it is often gently packed with a gauze or another wound dressing. Once it is packed, it will be covered with gauze and tape or some other type of wound dressing. AFTER THE PROCEDURE   Often, you will be allowed to go home right after the procedure.  You may be given antibiotic medicine to prevent or heal an infection.  If the area was packed with gauze or some other wound dressing, you will likely need to come back in 1 to 2 days to get it removed.  The area should heal in about 14 days.   This information is not intended to replace advice given to you by your health care provider. Make sure you discuss any questions you have with your health care provider.   Document Released: 07/17/2000 Document Revised: 07/23/2011 Document Reviewed: 03/18/2011 Elsevier Interactive Patient Education [employer] Elsevier Inc.

## 2015-05-24 NOTE — ED Notes (Addendum)
[facility] a [facility] an abscess on the top of her right buttocks that has been there and inflamed x 3 days.

## 2015-05-24 NOTE — ED Provider Notes (Signed)
CSN: [id number]     Arrival date & time 05/24/15  1746 History   First MD Initiated Contact with Patient 05/24/15 1943     Chief Complaint  Patient presents with  . Abscess   (Consider location/radiation/quality/duration/timing/severity/associated sxs/prior Treatment) HPI Comments: 25 year old female states he has noticed a boil on her left buttock near the gluteal fold for 3 days. It is gradually increased in size pain and tenderness. No drainage or bleeding.   Past Medical History  Diagnosis Date  . Asthma     as a child  . Herpes    Past Surgical History  Procedure Laterality Date  . Tonsillectomy    . Wisdom tooth extraction     Family History  Problem Relation Age of Onset  . Asthma Mother   . Alcohol abuse Mother   . Hypertension Father   . Seizures Son     febrile  . Diabetes Maternal Grandmother    Social History  Substance Use Topics  . Smoking status: Never Smoker   . Smokeless tobacco: None  . Alcohol Use: No   OB History    Gravida Para Term Preterm AB TAB SAB Ectopic Multiple Living   3 3 3       0 3     Review of Systems  Constitutional: Negative.   Respiratory: Negative.   Genitourinary: Negative.   Musculoskeletal: Negative.   Skin: Positive for wound.  Hematological: Negative.     Allergies  Review of patient's allergies indicates no known allergies.  Home Medications   Prior to Admission medications   Medication Sig Start Date End Date Taking? Authorizing Provider  acetaminophen (TYLENOL) 500 MG tablet Take 1,000 mg by mouth every 6 (six) hours as needed (for pain.).    Historical Provider, MD  HYDROcodone-acetaminophen (NORCO/VICODIN) 5-325 MG tablet Take 1 tablet by mouth every 4 (four) hours as needed. 05/24/15   Hayden Rasmussenavid Astou Lada, NP  ondansetron (ZOFRAN) 4 MG tablet Take 1 tablet (4 mg total) by mouth every 8 (eight) hours as needed for nausea or vomiting. 12/11/14   Courteney Lyn Mackuen, MD   Meds Ordered and Administered this Visit   Medications - No data to display  BP 123/69 mmHg  Pulse 82  Temp(Src) 98.3 F (36.8 C) (Oral)  Resp 18  SpO2 100%  LMP 04/29/2015 (Exact Date)  Breastfeeding? No No data found.   Physical Exam  Constitutional: She is oriented to person, place, and time. She appears well-developed and well-nourished. No distress.  Eyes: EOM are normal.  Neck: Normal range of motion. Neck supple.  Cardiovascular: Normal rate.   Pulmonary/Chest: Effort normal. No respiratory distress.  Musculoskeletal: She exhibits no edema.  Neurological: She is alert and oriented to person, place, and time. She exhibits normal muscle tone.  Skin: Skin is warm and dry.  Abscess to the upper left buttock adjacent to the gluteal cleft measuring approximately 3 cm across. Positive for tenderness and underlying induration. No lymphangitis. No cellulitis.  Psychiatric: She has a normal mood and affect.  Nursing note and vitals reviewed.   ED Course  .Marland Kitchen.Incision and Drainage Date/Time: 05/24/2015 8:22 PM Performed by: Phineas RealMABE, Kelen Laura Authorized by: Bradd CanaryKINDL, JAMES D Consent: Verbal consent obtained. Consent given by: patient Patient understanding: patient states understanding of the procedure being performed Type: abscess Body area: lower extremity Location details: left buttock Anesthesia: local infiltration Local anesthetic: lidocaine 2% with epinephrine Anesthetic total: 6 ml Scalpel size: 11 Incision type: single with marsupialization Incision depth: subcutaneous Complexity: complex Drainage:  purulent Drainage amount: moderate Wound treatment: drain placed Packing material: 1/4 in iodoform gauze Patient tolerance: Patient tolerated the procedure well with no immediate complications   (including critical care time)  Labs Review Labs Reviewed - No data to display  Imaging Review No results found.   Visual Acuity Review  Right Eye Distance:   Left Eye Distance:   Bilateral Distance:    Right Eye  Near:   Left Eye Near:    Bilateral Near:         MDM   1. Abscess of buttock, left    Warm compresses. Keep dry Return 2 days. Instructions for care given  I and D with packing and dressing    Hayden Rasmussen, NP 05/24/15 2026

## 2015-05-26 ENCOUNTER — Ambulatory Visit (HOSPITAL_COMMUNITY)
Admission: EM | Admit: 2015-05-26 | Discharge: 2015-05-26 | Disposition: A | Discharge status: Home / Self Care | Payer: No Typology Code available for payment source | Attending: Emergency Medicine | Admitting: Emergency Medicine

## 2015-05-26 ENCOUNTER — Encounter (HOSPITAL_COMMUNITY): Payer: Self-pay | Admitting: Emergency Medicine

## 2015-05-26 DIAGNOSIS — Z5189 Encounter for other specified aftercare: Secondary | ICD-10-CM

## 2015-05-26 MED ORDER — SULFAMETHOXAZOLE-TRIMETHOPRIM 800-160 MG PO TABS: 1.0000 | ORAL_TABLET | Freq: Two times a day (BID) | ORAL | Status: AC

## 2015-05-26 NOTE — ED Provider Notes (Signed)
CSN: [id number]     Arrival date & time 05/26/15  1313 History   First MD Initiated Contact with Patient 05/26/15 1320     Chief Complaint  Patient presents with  . Abscess   (Consider location/radiation/quality/duration/timing/severity/associated sxs/prior Treatment) HPI PT had i&d 4/19, here for wound check and packing removal. States she is doing well, painful to sit so unable to work at this time.  Past Medical History  Diagnosis Date  . Asthma     as a child  . Herpes    Past Surgical History  Procedure Laterality Date  . Tonsillectomy    . Wisdom tooth extraction     Family History  Problem Relation Age of Onset  . Asthma Mother   . Alcohol abuse Mother   . Hypertension Father   . Seizures Son     febrile  . Diabetes Maternal Grandmother    Social History  Substance Use Topics  . Smoking status: Never Smoker   . Smokeless tobacco: None  . Alcohol Use: No   OB History    Gravida Para Term Preterm AB TAB SAB Ectopic Multiple Living   3 3 3       0 3     Review of Systems Abscess check and packing removal.  Allergies  Review of patient's allergies indicates no known allergies.  Home Medications   Prior to Admission medications   Medication Sig Start Date End Date Taking? Authorizing Provider  HYDROcodone-acetaminophen (NORCO/VICODIN) 5-325 MG tablet Take 1 tablet by mouth every 4 (four) hours as needed. 05/24/15  Yes Hayden Rasmussenavid Mabe, NP  acetaminophen (TYLENOL) 500 MG tablet Take 1,000 mg by mouth every 6 (six) hours as needed (for pain.).    Historical Provider, MD  ondansetron (ZOFRAN) 4 MG tablet Take 1 tablet (4 mg total) by mouth every 8 (eight) hours as needed for nausea or vomiting. 12/11/14   Courteney Lyn Mackuen, MD  sulfamethoxazole-trimethoprim (BACTRIM DS,SEPTRA DS) 800-160 MG tablet Take 1 tablet by mouth 2 (two) times daily. 05/26/15 06/02/15  Tharon AquasFrank C Patrick, PA   Meds Ordered and Administered this Visit  Medications - No data to display  BP 121/75  mmHg  Pulse 118  Temp(Src) 98.6 F (37 C) (Oral)  Resp 16  SpO2 98%  LMP 04/29/2015 (Exact Date) No data found.   Physical Exam NURSES NOTES AND VITAL SIGNS REVIEWED. CONSTITUTIONAL: Well developed, well nourished, no acute distress HEENT: normocephalic, atraumatic EYES: Conjunctiva normal NECK:normal ROM, supple, no adenopathy PULMONARY:No respiratory distress, normal effort MUSCULOSKELETAL: Normal ROM of all extremities,  SKIN: warm and dry without rash PSYCHIATRIC: Mood and affect, behavior are normal Left Buttock, wound is noted. Packing in situ tenderness. No signs of cellulitis.  ED Course  Procedures (including critical care time)  Labs Review Labs Reviewed - No data to display  Imaging Review No results found.   Visual Acuity Review  Right Eye Distance:   Left Eye Distance:   Bilateral Distance:    Right Eye Near:   Left Eye Near:    Bilateral Near:         MDM   1. Encounter for wound re-check    Wound looks good, minimal amount of drainage expressed.  Return to work note provided Rx for Bactrim    Tharon AquasFrank C Patrick, GeorgiaPA 05/26/15 1348

## 2015-05-26 NOTE — ED Notes (Signed)
Here for a f/u for abscess on left buttocks.... Reports not feeling any better and pain remains 6/10.... Hydrocodone helps temp A&O x4... No acute distress.

## 2015-05-26 NOTE — Discharge Instructions (Signed)
Abscess Continue hot soaks and take antibiotic that is prescribed.  May return to work Monday An abscess (boil or furuncle) is an infected area on or under the skin. This area is filled with yellowish-white fluid (pus) and other material (debris). HOME CARE   Only take medicines as told by your doctor.  If you were given antibiotic medicine, take it as directed. Finish the medicine even if you start to feel better.  If gauze is used, follow your doctor's directions for changing the gauze.  To avoid spreading the infection:  Keep your abscess covered with a bandage.  Wash your hands well.  Do not share personal care items, towels, or whirlpools with others.  Avoid skin contact with others.  Keep your skin and clothes clean around the abscess.  Keep all doctor visits as told. GET HELP RIGHT AWAY IF:   You have more pain, puffiness (swelling), or redness in the wound site.  You have more fluid or blood coming from the wound site.  You have muscle aches, chills, or you feel sick.  You have a fever. MAKE SURE YOU:   Understand these instructions.  Will watch your condition.  Will get help right away if you are not doing well or get worse.   This information is not intended to replace advice given to you by your health care provider. Make sure you discuss any questions you have with your health care provider.   Document Released: 07/10/2007 Document Revised: 07/23/2011 Document Reviewed: 04/06/2011 Elsevier Interactive Patient Education [employer]2016 Elsevier Inc.

## 2015-07-20 ENCOUNTER — Encounter (HOSPITAL_COMMUNITY): Payer: Self-pay | Admitting: Emergency Medicine

## 2015-07-20 ENCOUNTER — Ambulatory Visit (HOSPITAL_COMMUNITY)
Admission: EM | Admit: 2015-07-20 | Discharge: 2015-07-20 | Disposition: A | Discharge status: Home / Self Care | Payer: Self-pay | Attending: Emergency Medicine | Admitting: Emergency Medicine

## 2015-07-20 DIAGNOSIS — Z79899 Other long term (current) drug therapy: Secondary | ICD-10-CM | POA: Insufficient documentation

## 2015-07-20 DIAGNOSIS — L02818 Cutaneous abscess of other sites: Secondary | ICD-10-CM | POA: Insufficient documentation

## 2015-07-20 DIAGNOSIS — Z9889 Other specified postprocedural states: Secondary | ICD-10-CM | POA: Insufficient documentation

## 2015-07-20 DIAGNOSIS — L0231 Cutaneous abscess of buttock: Secondary | ICD-10-CM

## 2015-07-20 DIAGNOSIS — J45909 Unspecified asthma, uncomplicated: Secondary | ICD-10-CM | POA: Insufficient documentation

## 2015-07-20 DIAGNOSIS — Z833 Family history of diabetes mellitus: Secondary | ICD-10-CM | POA: Insufficient documentation

## 2015-07-20 DIAGNOSIS — Z811 Family history of alcohol abuse and dependence: Secondary | ICD-10-CM | POA: Insufficient documentation

## 2015-07-20 DIAGNOSIS — Z8249 Family history of ischemic heart disease and other diseases of the circulatory system: Secondary | ICD-10-CM | POA: Insufficient documentation

## 2015-07-20 MED ORDER — HYDROCODONE-ACETAMINOPHEN 5-325 MG PO TABS: 1.0000 | ORAL_TABLET | Freq: Four times a day (QID) | ORAL | Status: DC | PRN

## 2015-07-20 MED ORDER — CLINDAMYCIN HCL 300 MG PO CAPS: 300.0000 mg | ORAL_CAPSULE | Freq: Three times a day (TID) | ORAL | Status: DC

## 2015-07-20 MED ORDER — IBUPROFEN 800 MG PO TABS
800.0000 mg | ORAL_TABLET | Freq: Once | ORAL | Status: AC
  Administered 2015-07-20: 800 mg via ORAL

## 2015-07-20 MED ORDER — IBUPROFEN 800 MG PO TABS
ORAL_TABLET | ORAL | Status: AC
  Filled 2015-07-20: qty 1

## 2015-07-20 NOTE — Discharge Instructions (Signed)
The abscess is draining on its own. I did collect a culture. I will call you with the results in 2-3 days. Take clindamycin 3 times a day for 10 days. This may cause some diarrhea. Use the hydrocodone every 4-6 hours as needed for pain. Apply warm compresses or heating pad as often as you can to help the abscess drain. Follow-up as needed.

## 2015-07-20 NOTE — ED Provider Notes (Addendum)
CSN: [id number]     Arrival date & time 07/20/15  1906 History   First MD Initiated Contact with Patient 07/20/15 2047     Chief Complaint  Patient presents with  . Abscess   (Consider location/radiation/quality/duration/timing/severity/associated sxs/prior Treatment) HPI She is a 25 year old woman here for evaluation of left buttock abscess. For the last 2-3 months she has been having recurring abscesses, primarily on her buttocks. She did have one lanced here about 2 months ago. She was placed on Bactrim at that time. She states whenever one goes away another one pops up. No fevers or chills. The current abscess top step a few days ago. It is quite painful. She has noticed a little bit of drainage today.  Past Medical History  Diagnosis Date  . Asthma     as a child  . Herpes    Past Surgical History  Procedure Laterality Date  . Tonsillectomy    . Wisdom tooth extraction     Family History  Problem Relation Age of Onset  . Asthma Mother   . Alcohol abuse Mother   . Hypertension Father   . Seizures Son     febrile  . Diabetes Maternal Grandmother    Social History  Substance Use Topics  . Smoking status: Never Smoker   . Smokeless tobacco: None  . Alcohol Use: No   OB History    Gravida Para Term Preterm AB TAB SAB Ectopic Multiple Living   3 3 3       0 3     Review of Systems As in history of present illness Allergies  Review of patient's allergies indicates no known allergies.  Home Medications   Prior to Admission medications   Medication Sig Start Date End Date Taking? Authorizing Provider  clindamycin (CLEOCIN) 300 MG capsule Take 1 capsule (300 mg total) by mouth 3 (three) times daily. 07/20/15   Charm RingsErin J Orie Baxendale, MD  HYDROcodone-acetaminophen (NORCO) 5-325 MG tablet Take 1 tablet by mouth every 6 (six) hours as needed for moderate pain. 07/20/15   Charm RingsErin J Ernestina Joe, MD   Meds Ordered and Administered this Visit   Medications  ibuprofen (ADVIL,MOTRIN) tablet 800  mg (800 mg Oral Given 07/20/15 2125)    BP 106/71 mmHg  Pulse 85  Temp(Src) 98.9 F (37.2 C) (Oral)  Resp 16  Ht 5\' 6"  (1.676 m)  Wt 108 lb (48.988 kg)  BMI 17.44 kg/m2  SpO2 100%  LMP 06/24/2015 No data found.   Physical Exam  Constitutional: She is oriented to person, place, and time. She appears well-developed and well-nourished. No distress.  Cardiovascular: Normal rate.   Pulmonary/Chest: Effort normal.  Neurological: She is alert and oriented to person, place, and time.  Skin:  Left buttock: The superior lateral aspect she has a 2 x 2 centimeter area of induration with central fluctuance. This is draining spontaneously. Was able to express a moderate amount of pus. Minimal overlying erythema.    ED Course  Procedures (including critical care time)  Labs Review Labs Reviewed  AEROBIC CULTURE (SUPERFICIAL SPECIMEN)    Imaging Review No results found.   MDM   1. Abscess of buttock, left    Wound culture collected and sent. Will treat with clindamycin and hydrocodone. Recommended warm compresses frequently for the next 2-3 days. Also discussed using Hibiclens once a week to try to prevent recurrences. Follow-up as needed.    Charm RingsErin J Tziporah Knoke, MD 07/20/15 2119  Charm RingsErin J Khaleah Duer, MD 07/20/15 2133

## 2015-07-20 NOTE — ED Notes (Signed)
Pt reports she has had recurrent abscesses on her buttocks. PT had one lanced 1 month ago. PT has another large abscess on her buttocks now, per PT.

## 2015-07-23 LAB — AEROBIC CULTURE  (SUPERFICIAL SPECIMEN)

## 2015-07-23 LAB — AEROBIC CULTURE W GRAM STAIN (SUPERFICIAL SPECIMEN)

## 2015-07-25 ENCOUNTER — Telehealth (HOSPITAL_COMMUNITY): Payer: Self-pay | Admitting: Emergency Medicine

## 2015-07-25 NOTE — ED Notes (Signed)
Called [phone] but VM has not been set up yet.  Need to give lab results from recent visit on 6/15  Per Dr. Dayton ScrapeMurray,  Notes Recorded by Charm RingsErin J Honig, MD on 07/25/2015 at 4:23 PM Culture positive for MRSA, sensitive to clindamycin. Complete course of clindamycin. Follow up as needed.

## 2015-07-31 NOTE — ED Notes (Signed)
2nd attempt... Called [phone] but VM has not been set up yet.  Need to give lab results from recent visit on 6/15 Mailed letter as 3rd attempt.   Per Dr. Dayton ScrapeMurray,  Notes Recorded by Charm RingsErin J Honig, MD on 07/25/2015 at 4:23 PM Culture positive for MRSA, sensitive to clindamycin. Complete course of clindamycin. Follow up as needed.

## 2016-03-07 ENCOUNTER — Encounter (HOSPITAL_COMMUNITY): Payer: Self-pay | Admitting: Emergency Medicine

## 2016-03-07 ENCOUNTER — Ambulatory Visit (HOSPITAL_COMMUNITY)
Admission: EM | Admit: 2016-03-07 | Discharge: 2016-03-07 | Disposition: A | Discharge status: Home / Self Care | Payer: Medicaid Other | Attending: Family Medicine | Admitting: Family Medicine

## 2016-03-07 DIAGNOSIS — R6889 Other general symptoms and signs: Secondary | ICD-10-CM

## 2016-03-07 MED ORDER — ONDANSETRON 8 MG PO TBDP: 8.0000 mg | ORAL_TABLET | Freq: Three times a day (TID) | ORAL | 0 refills | Status: DC | PRN

## 2016-03-07 MED ORDER — OSELTAMIVIR PHOSPHATE 75 MG PO CAPS: 75.0000 mg | ORAL_CAPSULE | Freq: Two times a day (BID) | ORAL | 0 refills | Status: DC

## 2016-03-07 NOTE — Discharge Instructions (Signed)
You need to make a point with an obstetrician for routine obstetrical care.  You may take Tylenol to control the fever.

## 2016-03-07 NOTE — ED Triage Notes (Signed)
Pt c/o cold sx onset: 3 days  Sx include: ST, BA, dyspnea, nasal congestion, vomiting, fever, chills  Pt is 3 months pregnant  Taking: OTC cold meds w/temp relief.   A&O x4... NAD

## 2016-03-07 NOTE — ED Provider Notes (Signed)
[facility]    CSN: [id number] Arrival date & time: 03/07/16  1821     History   Chief Complaint Chief Complaint  Patient presents with  . URI    HPI [name] is a 26 y.o. female.   This is a 26 year old woman who [facility] center with symptoms of sore throat, body aches, vomiting, and myalgia. Patient works at [employer]. She's been working all week. She's had no abdominal pain. Patient's symptoms began 2-3 days ago  Patient says that her last menstrual was in October and that she is pregnant.      Past Medical History:  Diagnosis Date  . Asthma    as a child  . Herpes     Patient Active Problem List   Diagnosis Date Noted  . Labor and delivery indication for care or intervention 04/20/2014  . SVD (spontaneous vaginal delivery) 04/20/2014    Past Surgical History:  Procedure Laterality Date  . TONSILLECTOMY    . WISDOM TOOTH EXTRACTION      OB History    Gravida Para Term Preterm AB Living   4 3 3     3    SAB TAB Ectopic Multiple Live Births         0 3       Home Medications    Prior to Admission medications   Medication Sig Start Date End Date Taking? Authorizing Provider  clindamycin (CLEOCIN) 300 MG capsule Take 1 capsule (300 mg total) by mouth 3 (three) times daily. 07/20/15   Charm RingsErin J Honig, MD  HYDROcodone-acetaminophen (NORCO) 5-325 MG tablet Take 1 tablet by mouth every 6 (six) hours as needed for moderate pain. 07/20/15   Charm RingsErin J Honig, MD  ondansetron (ZOFRAN-ODT) 8 MG disintegrating tablet Take 1 tablet (8 mg total) by mouth every 8 (eight) hours as needed for nausea. 03/07/16   Elvina SidleKurt Sherene Plancarte, MD  oseltamivir (TAMIFLU) 75 MG capsule Take 1 capsule (75 mg total) by mouth every 12 (twelve) hours. 03/07/16   Elvina SidleKurt Rhyleigh Grassel, MD    Family History Family History  Problem Relation Age of Onset  . Asthma Mother   . Alcohol abuse Mother   . Hypertension Father   .  Seizures Son     febrile  . Diabetes Maternal Grandmother     Social History Social History  Substance Use Topics  . Smoking status: Never Smoker  . Smokeless tobacco: Never Used  . Alcohol use No     Allergies   Patient has no known allergies.   Review of Systems Review of Systems  Constitutional: Positive for fatigue and fever.  HENT: Positive for congestion and sore throat.   Respiratory: Positive for cough.   Cardiovascular: Positive for chest pain.  Gastrointestinal: Positive for vomiting. Negative for abdominal pain and diarrhea.  Musculoskeletal: Positive for myalgias.  Neurological: Positive for headaches.     Physical Exam Triage Vital Signs ED Triage Vitals  Enc Vitals Group     BP 03/07/16 1850 98/57     Pulse Rate 03/07/16 1850 107     Resp 03/07/16 1850 22     Temp 03/07/16 1850 100 F (37.8 C)     Temp Source 03/07/16 1850 Oral     SpO2 03/07/16 1850 100 %     Weight --      Height --      Head Circumference --  Peak Flow --      Pain Score 03/07/16 1849 8     Pain Loc --      Pain Edu? --      Excl. in GC? --    No data found.   Updated Vital Signs BP 98/57 (BP Location: Left Arm)   Pulse 107   Temp 100 F (37.8 C) (Oral)   Resp 22   LMP 12/05/2015   SpO2 100%   Breastfeeding? No    Physical Exam  Constitutional: She appears well-developed and well-nourished.  HENT:  Right Ear: External ear normal.  Left Ear: External ear normal.  Mouth/Throat: Oropharynx is clear and moist.  Eyes: Conjunctivae are normal. Pupils are equal, round, and reactive to light.  Neck: Normal range of motion. Neck supple.  Cardiovascular: Normal rate, regular rhythm and normal heart sounds.   Pulmonary/Chest: Effort normal and breath sounds normal.  Abdominal: Soft. Bowel sounds are normal.  Musculoskeletal: Normal range of motion.  Neurological: She is alert.  Skin: Skin is warm and dry.  Nursing note and vitals reviewed.    UC Treatments /  Results  Labs (all labs ordered are listed, but only abnormal results are displayed) Labs Reviewed - No data to display  EKG  EKG Interpretation None       Radiology No results found.  Procedures Procedures (including critical care time)  Medications Ordered in UC Medications - No data to display   Initial Impression / Assessment and Plan / UC Course  I have reviewed the triage vital signs and the nursing notes.  Pertinent labs & imaging results that were available during my care of the patient were reviewed by me and considered in my medical decision making (see chart for details).     Final Clinical Impressions(s) / UC Diagnoses   Final diagnoses:  Flu-like symptoms    New Prescriptions New Prescriptions   ONDANSETRON (ZOFRAN-ODT) 8 MG DISINTEGRATING TABLET    Take 1 tablet (8 mg total) by mouth every 8 (eight) hours as needed for nausea.   OSELTAMIVIR (TAMIFLU) 75 MG CAPSULE    Take 1 capsule (75 mg total) by mouth every 12 (twelve) hours.     Elvina Sidle, MD 03/07/16 1900

## 2016-05-23 ENCOUNTER — Encounter: Payer: Medicaid Other | Admitting: Obstetrics and Gynecology

## 2016-06-10 ENCOUNTER — Encounter (HOSPITAL_COMMUNITY): Payer: Self-pay

## 2016-06-11 ENCOUNTER — Telehealth: Payer: Self-pay

## 2016-06-11 NOTE — Telephone Encounter (Signed)
Pt no showed for app[facility] on April 19th [facility].  [facility]

## 2016-06-23 ENCOUNTER — Encounter (HOSPITAL_COMMUNITY): Payer: Self-pay

## 2016-06-23 ENCOUNTER — Inpatient Hospital Stay (HOSPITAL_COMMUNITY)
Admission: AD | Admit: 2016-06-23 | Discharge: 2016-06-23 | Disposition: A | Discharge status: Home / Self Care | Payer: Medicaid Other | Source: Ambulatory Visit | Attending: Obstetrics & Gynecology | Admitting: Obstetrics & Gynecology

## 2016-06-23 DIAGNOSIS — M79604 Pain in right leg: Secondary | ICD-10-CM | POA: Diagnosis present

## 2016-06-23 DIAGNOSIS — Z79899 Other long term (current) drug therapy: Secondary | ICD-10-CM | POA: Insufficient documentation

## 2016-06-23 DIAGNOSIS — O9989 Other specified diseases and conditions complicating pregnancy, childbirth and the puerperium: Secondary | ICD-10-CM | POA: Diagnosis not present

## 2016-06-23 MED ORDER — CYCLOBENZAPRINE HCL 10 MG PO TABS
10.0000 mg | ORAL_TABLET | Freq: Once | ORAL | Status: AC
Start: 2016-06-23 — End: 2016-06-23
  Administered 2016-06-23: 10 mg via ORAL
  Filled 2016-06-23: qty 1

## 2016-06-23 MED ORDER — CYCLOBENZAPRINE HCL 5 MG PO TABS: 5.0000 mg | ORAL_TABLET | Freq: Three times a day (TID) | ORAL | 0 refills | Status: DC | PRN

## 2016-06-23 NOTE — MAU Note (Addendum)
G4P3 @ [redacted] wksga. Presents to triage today from a fall last night around 2200. Denies LOF or bleeding. + FM. EFM applied .   Provide at bs assessing pt. Orders received.  1548: Pt laying quietly with eyes closed. Flexeril PO given for thigh pain.   1620: pt cont to rest quietly with eyes closed.   1638: Orders received for d/c  1640: Discharge instructions given with pt understanding; pt left unit via ambulatory.

## 2016-06-23 NOTE — Discharge Instructions (Signed)
Musculoskeletal Pain Musculoskeletal pain is muscle and bone aches and pains. This pain can occur in any part of the body. Follow these instructions at home:  Only take medicines for pain, discomfort, or fever as told by your health care provider.  You may continue all activities unless the activities cause more pain. When the pain lessens, slowly resume normal activities. Gradually increase the intensity and duration of the activities or exercise.  During periods of severe pain, bed rest may be helpful. Lie or sit in any position that is comfortable, but get out of bed and walk around at least every several hours.  If directed, put ice on the injured area.  Put ice in a plastic bag.  Place a towel between your skin and the bag.  Leave the ice on for 20 minutes, 2-3 times a day. Contact a health care provider if:  Your pain is getting worse.  Your pain is not relieved with medicines.  You lose function in the area of the pain if the pain is in your arms, legs, or neck. This information is not intended to replace advice given to you by your health care provider. Make sure you discuss any questions you have with your health care provider. Document Released: 01/21/2005 Document Revised: 07/04/2015 Document Reviewed: 09/25/2012 Elsevier Interactive Patient Education  2017 Elsevier Inc.   Musculoskeletal Pain Musculoskeletal pain is muscle and bone aches and pains. This pain can occur in any part of the body. Follow these instructions at home:  Only take medicines for pain, discomfort, or fever as told by your health care provider.  You may continue all activities unless the activities cause more pain. When the pain lessens, slowly resume normal activities. Gradually increase the intensity and duration of the activities or exercise.  During periods of severe pain, bed rest may be helpful. Lie or sit in any position that is comfortable, but get out of bed and walk around at least  every several hours.  If directed, put ice on the injured area.  Put ice in a plastic bag.  Place a towel between your skin and the bag.  Leave the ice on for 20 minutes, 2-3 times a day. Contact a health care provider if:  Your pain is getting worse.  Your pain is not relieved with medicines.  You lose function in the area of the pain if the pain is in your arms, legs, or neck. This information is not intended to replace advice given to you by your health care provider. Make sure you discuss any questions you have with your health care provider. Document Released: 01/21/2005 Document Revised: 07/04/2015 Document Reviewed: 09/25/2012 Elsevier Interactive Patient Education  2017 [employer]

## 2016-06-23 NOTE — MAU Provider Note (Signed)
History   Patient [name] is a 26 y.o.  [phone] At 5171w0d Here with complaints of right sided leg pain after she fell last night walking to her car after work. Endorses positive fetal movement, denies bleeding or leaking of fluid.  She arrived by EMS.  CSN: [id number]  Arrival date and time: 06/23/16 1437   First Provider Initiated Contact with Patient 06/23/16 1502      Chief Complaint  Patient presents with  . Fall   HPI Patient says that she was walking to her car at 10 pm last night and she tripped when she was opening car door and she fell backwards onto her rear end. She did not hit her head and she did not hit her belly. She does not know exactly how she fell, says it "just happened". Says she has been very clumsy in this pregnancy and is tired from working two jobs.  Her inner thigh pain started as soon as she fell down.  She says the pain is a 7/10.  OB History    Gravida Para Term Preterm AB Living   4 3 3     3    SAB TAB Ectopic Multiple Live Births         0 3      Past Medical History:  Diagnosis Date  . Asthma    as a child  . Herpes     Past Surgical History:  Procedure Laterality Date  . TONSILLECTOMY    . WISDOM TOOTH EXTRACTION      Family History  Problem Relation Age of Onset  . Asthma Mother   . Alcohol abuse Mother   . Hypertension Father   . Seizures Son        febrile  . Diabetes Maternal Grandmother     Social History  Substance Use Topics  . Smoking status: Never Smoker  . Smokeless tobacco: Never Used  . Alcohol use No    Allergies: No Known Allergies  Prescriptions Prior to Admission  Medication Sig Dispense Refill Last Dose  . clindamycin (CLEOCIN) 300 MG capsule Take 1 capsule (300 mg total) by mouth 3 (three) times daily. 30 capsule 0 Unknown at Unknown time  . HYDROcodone-acetaminophen (NORCO) 5-325 MG tablet Take 1 tablet by mouth every 6 (six) hours as needed for moderate pain. 20 tablet 0 Unknown at Unknown time   . ondansetron (ZOFRAN-ODT) 8 MG disintegrating tablet Take 1 tablet (8 mg total) by mouth every 8 (eight) hours as needed for nausea. 30 tablet 0   . oseltamivir (TAMIFLU) 75 MG capsule Take 1 capsule (75 mg total) by mouth every 12 (twelve) hours. 10 capsule 0     Review of Systems  Respiratory: Negative.   Cardiovascular: Negative.   Gastrointestinal: Negative.   Genitourinary: Negative.   Neurological: Negative.   Psychiatric/Behavioral: Negative.    Physical Exam   Blood pressure 115/70, pulse 81, temperature 98.7 F (37.1 C), temperature source Oral, resp. rate 16, height 5\' 6"  (1.676 m), weight 133 lb (60.3 kg), last menstrual period 11/19/2015, SpO2 100 %.  Physical Exam  Constitutional: She is oriented to person, place, and time. She appears well-developed and well-nourished.  HENT:  Head: Normocephalic.  Neck: Normal range of motion.  Respiratory: Effort normal.  GI: Soft.  Musculoskeletal:  Right thigh does not appear bruised, no swelling, no redness.   Neurological: She is alert and oriented to person, place, and time. She has normal reflexes.  Skin: Skin is warm  and dry.  Psychiatric: She has a normal mood and affect.    MAU Course  Procedures  MDM -NST: 130 with present acels, negative decels, moderate variabiltiy and occasional contractions.  -Patient resting in bed, says that her pain is reduced.    Assessment and Plan   1. Leg pain, anterior, right    2. Patient stable for discharge with recommendation for follow up with her regular OB gyn provider ([facility]) 3. RX for Flexeril sent to pharmacy on file, advised patient on comfort measures for muscle pain (heat, gentle stretching). Avoid Ibuprofen but tylenol is ok.  4. Reviewed when to return to the MAU (bleeding, leaking of fluid, decreased fetal movements).   Charlesetta Garibaldi Marcile Fuquay CNM 06/23/2016, 3:03 PM

## 2016-08-01 ENCOUNTER — Encounter: Payer: Self-pay | Admitting: *Deleted

## 2016-08-06 ENCOUNTER — Telehealth: Payer: Self-pay | Admitting: General Practice

## 2016-08-06 ENCOUNTER — Encounter: Payer: Medicaid Other | Admitting: Obstetrics and Gynecology

## 2016-08-06 ENCOUNTER — Encounter: Payer: Self-pay | Admitting: General Practice

## 2016-08-06 NOTE — Telephone Encounter (Signed)
LM on voicemail for patient to give our office a call to reschedule missed New OB appointment today.  Certified letter has been mailed as well.

## 2016-08-19 ENCOUNTER — Encounter (HOSPITAL_COMMUNITY): Payer: Self-pay | Admitting: *Deleted

## 2016-08-19 ENCOUNTER — Inpatient Hospital Stay (HOSPITAL_COMMUNITY)
Admission: AD | Admit: 2016-08-19 | Discharge: 2016-08-19 | Disposition: A | Discharge status: Home / Self Care | Payer: Medicaid Other | Source: Ambulatory Visit | Attending: Obstetrics & Gynecology | Admitting: Obstetrics & Gynecology

## 2016-08-19 DIAGNOSIS — O471 False labor at or after 37 completed weeks of gestation: Secondary | ICD-10-CM | POA: Insufficient documentation

## 2016-08-19 DIAGNOSIS — Z3A39 39 weeks gestation of pregnancy: Secondary | ICD-10-CM | POA: Insufficient documentation

## 2016-08-19 LAB — URINALYSIS, ROUTINE W REFLEX MICROSCOPIC
Bilirubin Urine: NEGATIVE
GLUCOSE, UA: NEGATIVE mg/dL
Hgb urine dipstick: NEGATIVE
KETONES UR: NEGATIVE mg/dL
Nitrite: NEGATIVE
PROTEIN: NEGATIVE mg/dL
Specific Gravity, Urine: 1.005 (ref 1.005–1.030)
pH: 7 (ref 5.0–8.0)

## 2016-08-19 NOTE — MAU Note (Signed)
I have communicated with Philipp DeputyKim Shaw CNM and reviewed vital signs:  Vitals:   08/19/16 1759  BP: 125/73  Pulse: 70  Resp: 18  Temp: 98.3 F (36.8 C)    Vaginal exam:  Dilation: 3 Effacement (%): 60, 70 Cervical Position: Posterior Station: -2 Presentation: Vertex Exam by:: K. WeissRN,   Also reviewed contraction pattern and that non-stress test is reactive.  It has been documented that patient is contracting irregularly with no cervical change over 1 hour not indicating active labor.  Patient denies any other complaints.  Based on this report provider has given order for discharge.  A discharge order and diagnosis entered by a provider.   Labor discharge instructions reviewed with patient.

## 2016-08-19 NOTE — Discharge Instructions (Signed)

## 2016-08-19 NOTE — MAU Note (Signed)
Contractions going on all day, getting closer and stronger. Now every 6-747min.  More swollen with this preg than her other 3. No bleeding or leaking.  Was 2+ when last checked.

## 2016-08-21 ENCOUNTER — Ambulatory Visit (INDEPENDENT_AMBULATORY_CARE_PROVIDER_SITE_OTHER): Payer: Medicaid Other | Admitting: Certified Nurse Midwife

## 2016-08-21 ENCOUNTER — Other Ambulatory Visit (HOSPITAL_COMMUNITY)
Admission: RE | Admit: 2016-08-21 | Discharge: 2016-08-21 | Disposition: A | Discharge status: Home / Self Care | Payer: Medicaid Other | Source: Ambulatory Visit | Attending: Certified Nurse Midwife | Admitting: Certified Nurse Midwife

## 2016-08-21 ENCOUNTER — Encounter: Payer: Self-pay | Admitting: Certified Nurse Midwife

## 2016-08-21 VITALS — BP 119/72 | HR 85 | Wt 147.8 lb

## 2016-08-21 DIAGNOSIS — Z3A39 39 weeks gestation of pregnancy: Secondary | ICD-10-CM | POA: Diagnosis not present

## 2016-08-21 DIAGNOSIS — O99013 Anemia complicating pregnancy, third trimester: Secondary | ICD-10-CM

## 2016-08-21 DIAGNOSIS — O0993 Supervision of high risk pregnancy, unspecified, third trimester: Secondary | ICD-10-CM | POA: Insufficient documentation

## 2016-08-21 DIAGNOSIS — O0933 Supervision of pregnancy with insufficient antenatal care, third trimester: Secondary | ICD-10-CM | POA: Diagnosis not present

## 2016-08-21 DIAGNOSIS — D509 Iron deficiency anemia, unspecified: Secondary | ICD-10-CM | POA: Insufficient documentation

## 2016-08-21 DIAGNOSIS — F1290 Cannabis use, unspecified, uncomplicated: Secondary | ICD-10-CM | POA: Diagnosis not present

## 2016-08-21 DIAGNOSIS — O0930 Supervision of pregnancy with insufficient antenatal care, unspecified trimester: Secondary | ICD-10-CM

## 2016-08-21 DIAGNOSIS — Z720 Tobacco use: Secondary | ICD-10-CM

## 2016-08-21 DIAGNOSIS — Z8619 Personal history of other infectious and parasitic diseases: Secondary | ICD-10-CM | POA: Diagnosis not present

## 2016-08-21 LAB — POCT URINALYSIS DIP (DEVICE)
Bilirubin Urine: NEGATIVE
GLUCOSE, UA: NEGATIVE mg/dL
HGB URINE DIPSTICK: NEGATIVE
Ketones, ur: NEGATIVE mg/dL
NITRITE: NEGATIVE
PROTEIN: 30 mg/dL — AB
SPECIFIC GRAVITY, URINE: 1.02 (ref 1.005–1.030)
UROBILINOGEN UA: 4 mg/dL — AB (ref 0.0–1.0)
pH: 7 (ref 5.0–8.0)

## 2016-08-21 NOTE — Addendum Note (Signed)
Addended by: Clearnce SorrelPICKARD, Addeline Calarco S on: 08/21/2016 04:56 PM   Modules accepted: Orders

## 2016-08-21 NOTE — Progress Notes (Signed)
   PRENATAL VISIT NOTE  Subjective:  [name] is a 26 y.o. [phone] at 5538w3d being seen today for transfer of prenatal care [facility] d/t severe iron-deficiency anemia. Her pregnancy is complicated by has Supervision of high risk pregnancy, antepartum, third trimester; Anemia affecting pregnancy; History of herpes simplex infection; Marijuana use; Late prenatal care; and Tobacco use on her problem list.  Patient reports irregular contractions, on/off and has questions about IOL.  Contractions: Irregular. Vag. Bleeding: None.  Movement: Present. Denies leaking of fluid.   The following portions of the patient's history were reviewed and updated as appropriate: allergies, current medications, past family history, past medical history, past social history, past surgical history and problem list. Problem list updated.  Prenatal records form [facility] reviewed.  Objective:   Vitals:   08/21/16 1242  BP: 119/72  Pulse: 85  Weight: 147 lb 12.8 oz (67 kg)    Fetal Status: Fetal Heart Rate (bpm): 130 Fundal Height: 39 cm Movement: Present  Presentation: Vertex  General:  Alert, oriented and cooperative. Patient is in no acute distress.  Skin: Skin is warm and dry. No rash noted.   Cardiovascular: Normal heart rate noted  Respiratory: Normal respiratory effort, no problems with respiration noted  Abdomen: Soft, gravid, appropriate for gestational age.  Pain/Pressure: Present     Pelvic: Cervical exam performed, 3cm/50%/-3        Extremities: Normal range of motion.  Edema: Trace  Mental Status:  Normal mood and affect. Normal behavior. Normal judgment and thought content.   Assessment and Plan:  Pregnancy: G4P3003 at 5638w3d  1. Supervision of high risk pregnancy, antepartum, third trimester - Reviewed records from [facility]. Transfer of care d/t severe iron-deficiency anemia - GBS, GC/Chlamydia collected today - Discussed labor precautions  2. Anemia affecting pregnancy in third trimester - Last  Hgb 7.0 - CBC w/Diff - Ferritin - Continue Ferrous sulfate. Counseled on taking it with Vitamin C, and encouraged iron-rich foods as well  3. History of herpes simplex infection - On suppression with Valtrex daily. Continue   4. Marijuana use - Counseled and encouraged cessation  5. Late prenatal care: [facility]  6. Tobacco use - Counseled and encouraged smoking cessation  Term labor symptoms and general obstetric precautions including but not limited to vaginal bleeding, contractions, leaking of fluid and fetal movement were reviewed in detail with the patient. Please refer to After Visit Summary for other counseling recommendations.  Return in about 1 week (around 08/28/2016).   Frederik PearJulie P Mckenna Boruff, MD

## 2016-08-22 LAB — CBC WITH DIFFERENTIAL/PLATELET
BASOS: 0 %
Basophils Absolute: 0 10*3/uL (ref 0.0–0.2)
EOS (ABSOLUTE): 0.1 10*3/uL (ref 0.0–0.4)
Eos: 1 %
HEMOGLOBIN: 7.3 g/dL — AB (ref 11.1–15.9)
Hematocrit: 23.7 % — ABNORMAL LOW (ref 34.0–46.6)
IMMATURE GRANS (ABS): 0 10*3/uL (ref 0.0–0.1)
Immature Granulocytes: 0 %
LYMPHS: 26 %
Lymphocytes Absolute: 2.3 10*3/uL (ref 0.7–3.1)
MCH: 20.9 pg — AB (ref 26.6–33.0)
MCHC: 30.8 g/dL — ABNORMAL LOW (ref 31.5–35.7)
MCV: 68 fL — AB (ref 79–97)
MONOCYTES: 7 %
Monocytes Absolute: 0.7 10*3/uL (ref 0.1–0.9)
Neutrophils Absolute: 5.9 10*3/uL (ref 1.4–7.0)
Neutrophils: 66 %
Platelets: 263 10*3/uL (ref 150–379)
RBC: 3.49 x10E6/uL — ABNORMAL LOW (ref 3.77–5.28)
RDW: 20.3 % — ABNORMAL HIGH (ref 12.3–15.4)
WBC: 9 10*3/uL (ref 3.4–10.8)

## 2016-08-22 LAB — CERVICOVAGINAL ANCILLARY ONLY
Bacterial vaginitis: NEGATIVE
CHLAMYDIA, DNA PROBE: NEGATIVE
Candida vaginitis: POSITIVE — AB
NEISSERIA GONORRHEA: NEGATIVE
TRICH (WINDOWPATH): NEGATIVE

## 2016-08-22 LAB — FERRITIN: Ferritin: 4 ng/mL — ABNORMAL LOW (ref 15–150)

## 2016-08-23 ENCOUNTER — Other Ambulatory Visit: Payer: Self-pay | Admitting: Obstetrics & Gynecology

## 2016-08-23 DIAGNOSIS — B373 Candidiasis of vulva and vagina: Secondary | ICD-10-CM

## 2016-08-23 DIAGNOSIS — D509 Iron deficiency anemia, unspecified: Secondary | ICD-10-CM

## 2016-08-23 DIAGNOSIS — O99013 Anemia complicating pregnancy, third trimester: Principal | ICD-10-CM

## 2016-08-23 DIAGNOSIS — B3731 Acute candidiasis of vulva and vagina: Secondary | ICD-10-CM

## 2016-08-23 NOTE — Progress Notes (Signed)
Labs reviewed for patient. Results for orders placed or performed in visit on 08/21/16 (from the past 72 hour(s))  Cervicovaginal ancillary only     Status: Abnormal   Collection Time: 08/21/16 12:00 AM  Result Value Ref Range   Bacterial vaginitis Negative for Bacterial Vaginitis Microorganisms     Comment: Normal Reference Range - Negative   Candida vaginitis **POSITIVE for Candida species** (A)     Comment: Normal Reference Range - Negative   Chlamydia Negative     Comment: Normal Reference Range - Negative   Neisseria gonorrhea Negative     Comment: Normal Reference Range - Negative   Trichomonas Negative     Comment: Normal Reference Range - Negative  POCT urinalysis dip (device)     Status: Abnormal   Collection Time: 08/21/16  1:11 PM  Result Value Ref Range   Glucose, UA NEGATIVE NEGATIVE mg/dL   Bilirubin Urine NEGATIVE NEGATIVE   Ketones, ur NEGATIVE NEGATIVE mg/dL   Specific Gravity, Urine 1.020 1.005 - 1.030   Hgb urine dipstick NEGATIVE NEGATIVE   pH 7.0 5.0 - 8.0   Protein, ur 30 (A) NEGATIVE mg/dL   Urobilinogen, UA 4.0 (H) 0.0 - 1.0 mg/dL   Nitrite NEGATIVE NEGATIVE   Leukocytes, UA SMALL (A) NEGATIVE    Comment: Biochemical Testing Only. Please order routine urinalysis from main lab if confirmatory testing is needed.  CBC w/Diff     Status: Abnormal   Collection Time: 08/21/16  1:34 PM  Result Value Ref Range   WBC 9.0 3.4 - 10.8 x10E3/uL   RBC 3.49 (L) 3.77 - 5.28 x10E6/uL   Hemoglobin 7.3 (L) 11.1 - 15.9 g/dL   Hematocrit 13.023.7 (L) 86.534.0 - 46.6 %   MCV 68 (L) 79 - 97 fL   MCH 20.9 (L) 26.6 - 33.0 pg   MCHC 30.8 (L) 31.5 - 35.7 g/dL   RDW 78.420.3 (H) 69.612.3 - 29.515.4 %   Platelets 263 150 - 379 x10E3/uL   Neutrophils 66 Not Estab. %   Lymphs 26 Not Estab. %   Monocytes 7 Not Estab. %   Eos 1 Not Estab. %   Basos 0 Not Estab. %   Neutrophils Absolute 5.9 1.4 - 7.0 x10E3/uL   Lymphocytes Absolute 2.3 0.7 - 3.1 x10E3/uL   Monocytes Absolute 0.7 0.1 - 0.9 x10E3/uL    EOS (ABSOLUTE) 0.1 0.0 - 0.4 x10E3/uL   Basophils Absolute 0.0 0.0 - 0.2 x10E3/uL   Immature Granulocytes 0 Not Estab. %   Immature Grans (Abs) 0.0 0.0 - 0.1 x10E3/uL  Ferritin     Status: Abnormal   Collection Time: 08/21/16  1:46 PM  Result Value Ref Range   Ferritin 4 (L) 15 - 150 ng/mL   Patient will be called and told to come in for parenteral iron infusion in MAU; orders placed.  Also will be treated for yeast infection at the same time.  MAU notified.  Tereso NewcomerAnyanwu, Daire Okimoto A, MD

## 2016-08-24 LAB — CULTURE, BETA STREP (GROUP B ONLY): STREP GP B CULTURE: POSITIVE — AB

## 2016-08-24 NOTE — Progress Notes (Signed)
Attempted to call patient. No answer. Will attempt again later today.   Raynelle FanningJulie P. Kymia Simi, MD OB Fellow

## 2016-08-24 NOTE — Progress Notes (Addendum)
Attempted to call patient again, no answer on mobile and home numbers. Reviewed EHR, [facility], and is s/p SVD now.   Raynelle FanningJulie P. Tyreka Henneke, MD OB Fellow

## 2016-08-26 ENCOUNTER — Encounter: Payer: Medicaid Other | Admitting: Certified Nurse Midwife

## 2016-08-29 ENCOUNTER — Encounter: Payer: Medicaid Other | Admitting: Student

## 2016-09-03 ENCOUNTER — Encounter: Payer: Medicaid Other | Admitting: Student

## 2016-09-23 ENCOUNTER — Encounter: Payer: Self-pay | Admitting: Family Medicine

## 2016-09-23 ENCOUNTER — Ambulatory Visit (INDEPENDENT_AMBULATORY_CARE_PROVIDER_SITE_OTHER): Payer: Medicaid Other | Admitting: Family Medicine

## 2016-09-23 DIAGNOSIS — Z30013 Encounter for initial prescription of injectable contraceptive: Secondary | ICD-10-CM

## 2016-09-23 DIAGNOSIS — Z8619 Personal history of other infectious and parasitic diseases: Secondary | ICD-10-CM

## 2016-09-23 DIAGNOSIS — Z3009 Encounter for other general counseling and advice on contraception: Secondary | ICD-10-CM

## 2016-09-23 LAB — POCT PREGNANCY, URINE: Preg Test, Ur: NEGATIVE

## 2016-09-23 MED ORDER — MEDROXYPROGESTERONE ACETATE 150 MG/ML IM SUSP
150.0000 mg | Freq: Once | INTRAMUSCULAR | Status: AC
  Administered 2016-09-23: 150 mg via INTRAMUSCULAR

## 2016-09-23 NOTE — Progress Notes (Signed)
Subjective:     [name] is a 26 y.o. female who presents for a postpartum visit. She is 4 weeks postpartum following a spontaneous vaginal delivery. I have fully reviewed the prenatal and intrapartum course via Care Everywhere. The delivery was at 39.5 gestational weeks. Outcome: vaginal birth after cesarean (VBAC). Anesthesia: epidural. Postpartum course was complicated by anemia, received 2 units PRBC prior to discharge (EBL was only 200mg , but Hgb was 7.7). Prior to discharge Hgb was 9.4, and patient reports that she has Hgb checked at [facility] last week and it was 12. She was started on nifedipine XL postpartum for elevated BPs, which she reports she is currently taking. She was not diagnosed with pre-eclampsia. Per EHR review, BPs were 120s-150s/70s-90s postpartum.  Baby's course has been uncomplicated. Baby is feeding by bottle - Similac Soy. Bleeding no bleeding. Bowel function is normal. Bladder function is normal. Patient is not sexually active. Contraception method is none. Postpartum depression screening: negative.  The following portions of the patient's history were reviewed and updated as appropriate: allergies, current medications, past family history, past medical history, past social history, past surgical history and problem list.  Review of Systems Pertinent items are noted in HPI.   Objective:    BP 124/75   Pulse 87   Ht 5\' 6"  (1.676 m)   Wt 115 lb 1.6 oz (52.2 kg)   LMP 11/19/2015   BMI 18.58 kg/m   General:  alert, cooperative and no distress   HEENT:  [place]/AT, sclerae anicteric, normal oral mucosa  Lungs: Normal WOB. Lungs CTAB  Heart:  Normal rate, regular rhythm, no rmurmurs  Abdomen: Soft, NT, ND   Skin:  warm and dry  Psych: Normal mood and affect        Assessment:     Normal postpartum exam. Normal BP on Procardia. Pap smear not done at today's visit (UTD, last pat 05/23/16 NILM per HD records).  Plan:    1. Contraception: BTL (papers signed May 3,2018).  Follow up in 1-2 weeks for BTL consult visit. Depo given today for interval contraception 2. BP: wnl today on procardia Xl which was initially PP. D/c today and follow up on BP in 1 week  Loretta Nicolas. Kaitelyn Jamison, MD OB Fellow   Future Appointments Date Time Provider Department Center  10/02/2016 1:30 PM [facility] NURSE [facility]  10/08/2016 1:40 PM Hermina Staggers, MD [facility]

## 2016-09-24 ENCOUNTER — Encounter: Payer: Self-pay | Admitting: Family Medicine

## 2016-09-24 ENCOUNTER — Ambulatory Visit: Payer: Medicaid Other | Admitting: Advanced Practice Midwife

## 2016-10-02 ENCOUNTER — Ambulatory Visit: Payer: Medicaid Other

## 2016-10-08 ENCOUNTER — Ambulatory Visit: Payer: Medicaid Other | Admitting: Obstetrics and Gynecology

## 2017-02-04 NOTE — L&D Delivery Note (Addendum)
Delivery Note At 12:15 PM a viable female was delivered via Vaginal, Spontaneous (Presentation: vertex).  APGAR: 9, 9; weight  pending.   Placenta status: intact, trailing membranes.  Cord: 3 vessel, umbilical cord cysts   with the following complications: none  Anesthesia:  None Episiotomy: None Lacerations: None Suture Repair: NA Est. Blood Loss (mL): 110  Mom to postpartum.  Baby to Couplet care / Skin to Skin.  JACOB E PERRIN 11/29/2017, 12:33 PM  OB FELLOW DELIVERY ATTESTATION  I was gloved and present for the delivery in its entirety, and I agree with the above resident's note.    Patient planning for PP BTL, papers signed 09/30/17. Posted for OR on 10/27. Will be NPO at [facility].   [name], D.O. OB Fellow  11/29/2017, 4:07 PM

## 2017-08-19 ENCOUNTER — Encounter (HOSPITAL_COMMUNITY): Payer: Self-pay | Admitting: Emergency Medicine

## 2017-08-19 ENCOUNTER — Emergency Department (HOSPITAL_COMMUNITY)
Admission: EM | Admit: 2017-08-19 | Discharge: 2017-08-20 | Disposition: A | Discharge status: Home / Self Care | Payer: Medicaid Other | Attending: Emergency Medicine | Admitting: Emergency Medicine

## 2017-08-19 DIAGNOSIS — F17200 Nicotine dependence, unspecified, uncomplicated: Secondary | ICD-10-CM | POA: Insufficient documentation

## 2017-08-19 DIAGNOSIS — I10 Essential (primary) hypertension: Secondary | ICD-10-CM | POA: Insufficient documentation

## 2017-08-19 DIAGNOSIS — Z3490 Encounter for supervision of normal pregnancy, unspecified, unspecified trimester: Secondary | ICD-10-CM

## 2017-08-19 DIAGNOSIS — J45909 Unspecified asthma, uncomplicated: Secondary | ICD-10-CM | POA: Insufficient documentation

## 2017-08-19 DIAGNOSIS — Z3A00 Weeks of gestation of pregnancy not specified: Secondary | ICD-10-CM | POA: Insufficient documentation

## 2017-08-19 DIAGNOSIS — N6459 Other signs and symptoms in breast: Secondary | ICD-10-CM | POA: Diagnosis not present

## 2017-08-19 DIAGNOSIS — N63 Unspecified lump in unspecified breast: Secondary | ICD-10-CM

## 2017-08-19 DIAGNOSIS — O9989 Other specified diseases and conditions complicating pregnancy, childbirth and the puerperium: Secondary | ICD-10-CM | POA: Diagnosis not present

## 2017-08-19 HISTORY — DX: Essential (primary) hypertension: I10

## 2017-08-19 HISTORY — DX: Anemia, unspecified: D64.9

## 2017-08-19 LAB — CBC
HCT: 28 % — ABNORMAL LOW (ref 36.0–46.0)
HEMOGLOBIN: 8.7 g/dL — AB (ref 12.0–15.0)
MCH: 26.9 pg (ref 26.0–34.0)
MCHC: 31.1 g/dL (ref 30.0–36.0)
MCV: 86.7 fL (ref 78.0–100.0)
PLATELETS: 299 10*3/uL (ref 150–400)
RBC: 3.23 MIL/uL — AB (ref 3.87–5.11)
RDW: 14 % (ref 11.5–15.5)
WBC: 11.4 10*3/uL — AB (ref 4.0–10.5)

## 2017-08-19 LAB — COMPREHENSIVE METABOLIC PANEL
ALT: 17 U/L (ref 0–44)
ANION GAP: 7 (ref 5–15)
AST: 16 U/L (ref 15–41)
Albumin: 2.9 g/dL — ABNORMAL LOW (ref 3.5–5.0)
Alkaline Phosphatase: 59 U/L (ref 38–126)
BUN: <5 mg/dL — ABNORMAL LOW (ref 6–20)
CO2: 25 mmol/L (ref 22–32)
CREATININE: 0.56 mg/dL (ref 0.44–1.00)
Calcium: 8.7 mg/dL — ABNORMAL LOW (ref 8.9–10.3)
Chloride: 103 mmol/L (ref 98–111)
Glucose, Bld: 80 mg/dL (ref 70–99)
Potassium: 3.2 mmol/L — ABNORMAL LOW (ref 3.5–5.1)
SODIUM: 135 mmol/L (ref 135–145)
Total Bilirubin: 0.5 mg/dL (ref 0.3–1.2)
Total Protein: 6.5 g/dL (ref 6.5–8.1)

## 2017-08-19 LAB — URINALYSIS, ROUTINE W REFLEX MICROSCOPIC
Bacteria, UA: NONE SEEN
Bilirubin Urine: NEGATIVE
Glucose, UA: NEGATIVE mg/dL
Hgb urine dipstick: NEGATIVE
Ketones, ur: 80 mg/dL — AB
Nitrite: NEGATIVE
PH: 6 (ref 5.0–8.0)
Protein, ur: 30 mg/dL — AB
Specific Gravity, Urine: 1.024 (ref 1.005–1.030)

## 2017-08-19 LAB — LIPASE, BLOOD: LIPASE: 32 U/L (ref 11–51)

## 2017-08-19 LAB — HCG, QUANTITATIVE, PREGNANCY: hCG, Beta Chain, Quant, S: 4896 m[IU]/mL — ABNORMAL HIGH (ref <5)

## 2017-08-19 NOTE — ED Triage Notes (Signed)
Patient reports low abdominal pain and left nipple swelling onset this week , denies emesis or diarrhea , no fever or chills , pt. stated that she is pregnant - unknown LMP/AOG .

## 2017-08-20 ENCOUNTER — Other Ambulatory Visit: Payer: Self-pay | Admitting: Family Medicine

## 2017-08-20 ENCOUNTER — Telehealth: Payer: Self-pay | Admitting: Family Medicine

## 2017-08-20 DIAGNOSIS — N611 Abscess of the breast and nipple: Secondary | ICD-10-CM

## 2017-08-20 MED ORDER — ACETAMINOPHEN 500 MG PO TABS
1000.0000 mg | ORAL_TABLET | Freq: Once | ORAL | Status: AC
  Administered 2017-08-20: 1000 mg via ORAL
  Filled 2017-08-20: qty 2

## 2017-08-20 MED ORDER — CEPHALEXIN 250 MG PO CAPS
500.0000 mg | ORAL_CAPSULE | Freq: Once | ORAL | Status: AC
  Administered 2017-08-20: 500 mg via ORAL
  Filled 2017-08-20: qty 2

## 2017-08-20 MED ORDER — SULFAMETHOXAZOLE-TRIMETHOPRIM 800-160 MG PO TABS: 1.0000 | ORAL_TABLET | Freq: Two times a day (BID) | ORAL | 0 refills | Status: AC

## 2017-08-20 MED ORDER — SODIUM CHLORIDE 0.9 % IV BOLUS
1000.0000 mL | Freq: Once | INTRAVENOUS | Status: AC
  Administered 2017-08-20: 1000 mL via INTRAVENOUS

## 2017-08-20 NOTE — Telephone Encounter (Signed)
Patient said she have a cyst on her left breast and need a US/

## 2017-08-20 NOTE — ED Provider Notes (Signed)
[facility] EMERGENCY DEPARTMENT Provider Note   CSN: [id number] Arrival date & time: 08/19/17  2107     History   Chief Complaint Chief Complaint  Patient presents with  . Abdominal Pain  . Nipple swelling    HPI [name] is a 27 y.o. female.  HPI  This is a 27 year old G5P4 who presents with left nipple swelling.  Patient reports that she is currently pregnant.  Last menstrual period to her knowledge was January 26.  She took a pregnancy test "several months ago" and it was positive.  She has not had any prenatal care at this time.  She states over the last 5 days she has had increasing left nipple swelling and pain.  She has not noted any nipple discharge.  She does have a 35-year-old at home but is not currently breast-feeding.  No fevers.  She denies any loss of fluids or vaginal bleeding.  She does report some pelvic pressure which she has had similarly in prior pregnancies.  Denies any abdominal pain or contractions.  She reports good fetal movement.  Past Medical History:  Diagnosis Date  . Anemia   . Asthma    as a child  . Herpes   . Hypertension     Patient Active Problem List   Diagnosis Date Noted  . History of herpes simplex infection 08/21/2016  . Marijuana use 08/21/2016  . Tobacco use 08/21/2016    Past Surgical History:  Procedure Laterality Date  . TONSILLECTOMY    . WISDOM TOOTH EXTRACTION       OB History    Gravida  4   Para  3   Term  3   Preterm      AB      Living  4     SAB      TAB      Ectopic      Multiple  0   Live Births  4            Home Medications    Prior to Admission medications   Medication Sig Start Date End Date Taking? Authorizing Provider  cyclobenzaprine (FLEXERIL) 5 MG tablet Take 1 tablet (5 mg total) by mouth every 8 (eight) hours as needed for muscle spasms. Patient not taking: Reported on 08/19/2016 06/23/16   Marylene Land, CNM    sulfamethoxazole-trimethoprim (BACTRIM DS,SEPTRA DS) 800-160 MG tablet Take 1 tablet by mouth 2 (two) times daily for 7 days. 08/20/17 08/27/17  Shon Baton, MD    Family History Family History  Problem Relation Age of Onset  . Asthma Mother   . Alcohol abuse Mother   . Hypertension Father   . Seizures Son        febrile  . Diabetes Maternal Grandmother     Social History Social History   Tobacco Use  . Smoking status: Current Every Day Smoker  . Smokeless tobacco: Never Used  Substance Use Topics  . Alcohol use: No  . Drug use: No     Allergies   Patient has no known allergies.   Review of Systems Review of Systems  Constitutional: Negative for fever.  Respiratory: Negative for shortness of breath.   Gastrointestinal: Negative for abdominal pain, nausea and vomiting.  Genitourinary: Negative for vaginal bleeding and vaginal discharge.  Skin: Negative for color change.       Nipple pain  All other systems reviewed and are negative.    Physical Exam Updated  Vital Signs BP 108/62   Pulse 80   Temp 98.6 F (37 C) (Oral)   Resp 16   SpO2 100%   Physical Exam  Constitutional: She is oriented to person, place, and time. She appears well-developed and well-nourished. No distress.  HENT:  Head: Normocephalic and atraumatic.  Eyes: Pupils are equal, round, and reactive to light.  Neck: Neck supple.  Cardiovascular: Normal rate, regular rhythm and normal heart sounds.  Pulmonary/Chest: Effort normal. No respiratory distress. She has no wheezes. Right breast exhibits no inverted nipple, no mass, no nipple discharge and no skin change. Left breast exhibits mass and tenderness. Left breast exhibits no inverted nipple, no nipple discharge and no skin change.  Asymmetrically enlarged left nipple, no significant erythema, no subareolar tenderness or breast tenderness or mass noted, no nipple discharge noted  Abdominal: Soft. Bowel sounds are normal.  Gravid above  the umbilicus, nontender  Musculoskeletal: She exhibits no edema.  Neurological: She is alert and oriented to person, place, and time.  Skin: Skin is warm and dry.  Psychiatric: She has a normal mood and affect.  Nursing note and vitals reviewed.    ED Treatments / Results  Labs (all labs ordered are listed, but only abnormal results are displayed) Labs Reviewed  COMPREHENSIVE METABOLIC PANEL - Abnormal; Notable for the following components:      Result Value   Potassium 3.2 (*)    BUN <5 (*)    Calcium 8.7 (*)    Albumin 2.9 (*)    All other components within normal limits  CBC - Abnormal; Notable for the following components:   WBC 11.4 (*)    RBC 3.23 (*)    Hemoglobin 8.7 (*)    HCT 28.0 (*)    All other components within normal limits  URINALYSIS, ROUTINE W REFLEX MICROSCOPIC - Abnormal; Notable for the following components:   APPearance CLOUDY (*)    Ketones, ur 80 (*)    Protein, ur 30 (*)    Leukocytes, UA TRACE (*)    All other components within normal limits  HCG, QUANTITATIVE, PREGNANCY - Abnormal; Notable for the following components:   hCG, Beta Chain, Quant, S 4,896 (*)    All other components within normal limits  LIPASE, BLOOD  I-STAT BETA HCG BLOOD, ED ([facility], [facility], [facility] ONLY)    EKG None  Radiology No results found.  Procedures Procedures (including critical care time)  EMERGENCY DEPARTMENT [place] PREGNANCY "Study: Limited Ultrasound of the Pelvis for Pregnancy"  INDICATIONS:Pregnancy(required) Multiple views of the uterus and pelvic cavity were obtained in real-time with a multi-frequency probe.  APPROACH:Transabdominal  PERFORMED BY: Myself IMAGES ARCHIVED?: Yes LIMITATIONS: Emergent procedure PREGNANCY FREE FLUID: None ADNEXAL FINDINGS:Unable to assess GESTATIONAL AGE, ESTIMATE: [redacted]w[redacted]d by BPD FETAL HEART RATE: 136 INTERPRETATION: 2nd trimester preg      Medications Ordered in ED Medications  sodium chloride 0.9 % bolus 1,000 mL (1,000 mLs  Intravenous New Bag/Given 08/20/17 0258)  cephALEXin (KEFLEX) capsule 500 mg (500 mg Oral Given 08/20/17 0335)  acetaminophen (TYLENOL) tablet 1,000 mg (1,000 mg Oral Given 08/20/17 0335)     Initial Impression / Assessment and Plan / ED Course  I have reviewed the triage vital signs and the nursing notes.  Pertinent labs & imaging results that were available during my care of the patient were reviewed by me and considered in my medical decision making (see chart for details).     Patient presents with primarily left nipple swelling.  Also reports  pregnancy for which she has not sought prenatal care.  She is overall nontoxic-appearing.  Afebrile.  No acute distress.  She has isolated swelling of the left nipple.  No nipple discharge or erythema, no subareolar mass or breast mass noted.  She is not currently breast-feeding.  Vital signs are reassuring.  Lab work reviewed.  No significant leukocytosis.  She does have 80 ketones in her urine.  Patient was given a liter of fluids.  She denies any abdominal cramping, contractions, loss of fluids.  Bedside ultrasound reveals approximately 25-week pregnancy with good fetal heart rate.  Discussed with patient that she likely needs a left breast ultrasound.  Given that she has not had any prenatal care, this would be best coordinated by OB/GYN.  I discussed the patient with Dr. Gust RungStenson, on-call OB.  He will arrange for her to follow-up in clinic.  In the meantime, given suspicion for possible nipple abscess, will place on Bactrim.  Patient was given strict return precautions.  After history, exam, and medical workup I feel the patient has been appropriately medically screened and is safe for discharge home. Pertinent diagnoses were discussed with the patient. Patient was given return precautions.   Final Clinical Impressions(s) / ED Diagnoses   Final diagnoses:  Breast swelling  Pregnancy, unspecified gestational age    ED Discharge Orders         Ordered    sulfamethoxazole-trimethoprim (BACTRIM DS,SEPTRA DS) 800-160 MG tablet  2 times daily     08/20/17 0356       Horton, Mayer Maskerourtney F, MD 08/20/17 [phone]

## 2017-08-20 NOTE — Discharge Instructions (Addendum)
You were seen today for left nipple swelling.  You will be covered for infection.  You need to have an ultrasound of your left breast to rule out other causes.  If you develop fevers, redness, worsening pain or swelling to be reevaluated immediately.  Given this and your ongoing pregnancy, it is very [facility]  You need prenatal care and further evaluation.

## 2017-08-21 ENCOUNTER — Inpatient Hospital Stay (HOSPITAL_COMMUNITY)
Admission: AD | Admit: 2017-08-21 | Discharge: 2017-08-21 | Disposition: A | Discharge status: Home / Self Care | Payer: Medicaid Other | Source: Ambulatory Visit | Attending: Obstetrics & Gynecology | Admitting: Obstetrics & Gynecology

## 2017-08-21 ENCOUNTER — Other Ambulatory Visit: Payer: Self-pay

## 2017-08-21 ENCOUNTER — Encounter (HOSPITAL_COMMUNITY): Payer: Self-pay | Admitting: *Deleted

## 2017-08-21 DIAGNOSIS — D649 Anemia, unspecified: Secondary | ICD-10-CM | POA: Insufficient documentation

## 2017-08-21 DIAGNOSIS — N644 Mastodynia: Secondary | ICD-10-CM | POA: Insufficient documentation

## 2017-08-21 DIAGNOSIS — O91112 Abscess of breast associated with pregnancy, second trimester: Secondary | ICD-10-CM | POA: Diagnosis not present

## 2017-08-21 DIAGNOSIS — I10 Essential (primary) hypertension: Secondary | ICD-10-CM | POA: Insufficient documentation

## 2017-08-21 DIAGNOSIS — Z3A25 25 weeks gestation of pregnancy: Secondary | ICD-10-CM | POA: Diagnosis not present

## 2017-08-21 DIAGNOSIS — O91119 Abscess of breast associated with pregnancy, unspecified trimester: Secondary | ICD-10-CM

## 2017-08-21 DIAGNOSIS — O99512 Diseases of the respiratory system complicating pregnancy, second trimester: Secondary | ICD-10-CM | POA: Insufficient documentation

## 2017-08-21 DIAGNOSIS — O162 Unspecified maternal hypertension, second trimester: Secondary | ICD-10-CM | POA: Insufficient documentation

## 2017-08-21 DIAGNOSIS — J45909 Unspecified asthma, uncomplicated: Secondary | ICD-10-CM | POA: Diagnosis not present

## 2017-08-21 DIAGNOSIS — F17210 Nicotine dependence, cigarettes, uncomplicated: Secondary | ICD-10-CM | POA: Insufficient documentation

## 2017-08-21 DIAGNOSIS — Z87898 Personal history of other specified conditions: Secondary | ICD-10-CM | POA: Diagnosis not present

## 2017-08-21 DIAGNOSIS — Z8249 Family history of ischemic heart disease and other diseases of the circulatory system: Secondary | ICD-10-CM | POA: Diagnosis not present

## 2017-08-21 DIAGNOSIS — O99012 Anemia complicating pregnancy, second trimester: Secondary | ICD-10-CM | POA: Insufficient documentation

## 2017-08-21 DIAGNOSIS — O99332 Smoking (tobacco) complicating pregnancy, second trimester: Secondary | ICD-10-CM | POA: Insufficient documentation

## 2017-08-21 HISTORY — DX: Encounter for other specified aftercare: Z51.89

## 2017-08-21 HISTORY — DX: Gestational (pregnancy-induced) hypertension without significant proteinuria, unspecified trimester: O13.9

## 2017-08-21 MED ORDER — OXYCODONE-ACETAMINOPHEN 5-325 MG PO TABS: 1.0000 | ORAL_TABLET | Freq: Four times a day (QID) | ORAL | 0 refills | Status: DC | PRN

## 2017-08-21 NOTE — MAU Note (Addendum)
[facility]. No prenatal care.  Went there because left nipple is swollen.  Is in so much pain.  Was started on antibiotics.  Has appointment at breast center for 8/13.  Feels something else needs to be done, because she is in so much pain.  States is red and hot to touch, denies drainage or bleeding.

## 2017-08-21 NOTE — MAU Provider Note (Signed)
History     CSN: [id number]  Arrival date and time: 08/21/17 1230   None     Chief Complaint  Patient presents with  . Breast Pain   [name] is a 27 yo F G5P4004 at 25w per bedside U/S in ED yesterday who presents today with left nipple pain. Pt states that she recently went to the ED for this problem and they prescribed her abx and tylenol which has not been doing anything for her pain. The ED scheduled her an appt with the [facility] ut the appt wasn't until Aug 13th and her pain is not getting better at all. Abx have been in her system for about 48 hours now but pain is still getting worse. Pain is about 8/10 on pain scale. Pt denies any trauma to the nipple and is not breast feeding. Denies leakage or pus draining from the nipple.     OB History    Gravida  5   Para  4   Term  4   Preterm      AB      Living  4     SAB      TAB      Ectopic      Multiple  0   Live Births  4        Obstetric Comments  High BP, blood transfusion with #4        Past Medical History:  Diagnosis Date  . Anemia   . Asthma    as a child  . Blood transfusion without reported diagnosis   . Herpes   . Hypertension   . Pregnancy induced hypertension     Past Surgical History:  Procedure Laterality Date  . TONSILLECTOMY    . WISDOM TOOTH EXTRACTION      Family History  Problem Relation Age of Onset  . Asthma Mother   . Alcohol abuse Mother   . Cancer Mother        lung  . Hypertension Father   . Seizures Son        febrile  . Diabetes Maternal Grandmother     Social History   Tobacco Use  . Smoking status: Current Every Day Smoker    Packs/day: 0.25    Years: 1.00    Pack years: 0.25    Types: Cigarettes  . Smokeless tobacco: Never Used  Substance Use Topics  . Alcohol use: No  . Drug use: No    Allergies: No Known Allergies  Medications Prior to Admission  Medication Sig Dispense Refill Last Dose  . cyclobenzaprine (FLEXERIL) 5 MG  tablet Take 1 tablet (5 mg total) by mouth every 8 (eight) hours as needed for muscle spasms. (Patient not taking: Reported on 08/19/2016) 5 tablet 0 Completed Course at Unknown time  . sulfamethoxazole-trimethoprim (BACTRIM DS,SEPTRA DS) 800-160 MG tablet Take 1 tablet by mouth 2 (two) times daily for 7 days. 14 tablet 0     Review of Systems  Constitutional: Positive for chills. Negative for fever.  Skin:       Swelling of left nipple, no cracks or trauma   Physical Exam   Blood pressure 113/66, pulse 91, temperature 98.2 F (36.8 C), temperature source Oral, resp. rate 16, weight 57.3 kg (126 lb 4 oz), SpO2 100 %, unknown if currently breastfeeding.  Physical Exam  Constitutional: She is oriented to person, place, and time. She appears well-developed and well-nourished.  HENT:  Head: Normocephalic and atraumatic.  Eyes: Conjunctivae are normal.  Cardiovascular: Normal rate and normal heart sounds.  Respiratory: Effort normal. No respiratory distress.  GI: Soft.  Gravid  Musculoskeletal: Normal range of motion.  Neurological: She is alert and oriented to person, place, and time.  Skin: Skin is warm and dry.  Round erythematous mass noted on Left nipple, margins easily defined, no swelling or erythema to surrounding breast tissue    MAU Course  Procedures  FHT- wnl at 136 bpm  MDM Lesion on left nipple looks like an abscess. Will likely need I&D. Recommended she keep taking the Bactrim and tylenol. Called the breast center to get her in sooner, appt available tomorrow morning so recommended she followup there for further eval. Uterine fundus was   Assessment and Plan   1. Breast abscess during pregnancy, antepartum    -Follow-up tomorrow with Breast center for ultrasound and aspiration previously ordered by Dr. Adrian Blackwater.  -Recommended she cont taking abx and tylenol for pain management -Rx Percocet for pain -Recommended establishing prenatal care  [name]  MS3 08/21/2017, 2:01 PM

## 2017-08-21 NOTE — MAU Provider Note (Signed)
Chief Complaint:  Breast Pain   First Provider Initiated Contact with Patient 08/21/17 1414      HPI: [name] is a 27 y.o. [id number] at Unknown by unsure LMP who presents to maternity admissions reporting painful enlargement of left nipple.  She was seen in ED 7/16 and prescribed Bactrim and Tylenol. Dr Adrian Blackwater was consulted and OB care was established with [facility] office and imaging/aspiration was ordered at the [facility].  She has [place] and aspiration scheduled next month but is in so much pain she cannot wait.  She has tried warm compresses and the prescriptions mentioned but pain and swelling are worsening. There are no other symptoms. She denies fever/chills or n/v. She is feeling fetal movement in her pregnancy but does not know how far along she is. She denies abdominal pain or bleeding.   HPI  Past Medical History: Past Medical History:  Diagnosis Date  . Anemia   . Asthma    as a child  . Blood transfusion without reported diagnosis   . Herpes   . Hypertension   . Pregnancy induced hypertension     Past obstetric history: OB History  Gravida Para Term Preterm AB Living  5 4 4     4   SAB TAB Ectopic Multiple Live Births        0 4    # Outcome Date GA Lbr Len/2nd Weight Sex Delivery Anes PTL Lv  5 Current           4 Term 08/23/16    F   N LIV  3 Term 04/20/14 [redacted]w[redacted]d 03:13 / 00:08 6 lb 10.2 oz (3.011 kg) F Vag-Spont EPI  LIV  2 Term 02/01/12 [redacted]w[redacted]d 04:01 / 00:07 6 lb 4.9 oz (2.86 kg) M Vag-Spont EPI  LIV     Birth Comments: None  1 Term 06/08/08 [redacted]w[redacted]d 24:00 6 lb 14 oz (3.118 kg) M Vag-Spont EPI Y LIV     Birth Comments: preterm cervix thinning prog supp til 36 weeks, PIH inductionat 36 week    Obstetric Comments  High BP, blood transfusion with #4    Past Surgical History: Past Surgical History:  Procedure Laterality Date  . TONSILLECTOMY    . WISDOM TOOTH EXTRACTION      Family History: Family History  Problem Relation Age of Onset  . Asthma Mother   .  Alcohol abuse Mother   . Cancer Mother        lung  . Hypertension Father   . Seizures Son        febrile  . Diabetes Maternal Grandmother     Social History: Social History   Tobacco Use  . Smoking status: Current Every Day Smoker    Packs/day: 0.25    Years: 1.00    Pack years: 0.25    Types: Cigarettes  . Smokeless tobacco: Never Used  Substance Use Topics  . Alcohol use: No  . Drug use: No    Allergies: No Known Allergies  Meds:  No medications prior to admission.    ROS:  Review of Systems  Constitutional: Negative for chills, fatigue and fever.  Eyes: Negative for visual disturbance.  Respiratory: Negative for shortness of breath.   Cardiovascular: Negative for chest pain.  Gastrointestinal: Negative for abdominal pain, nausea and vomiting.  Genitourinary: Negative for difficulty urinating, dysuria, flank pain, pelvic pain, vaginal bleeding, vaginal discharge and vaginal pain.  Skin: Positive for wound.       Enlarged left  nipple with pain  Neurological: Negative for dizziness and headaches.  Psychiatric/Behavioral: Negative.      I have reviewed patient's Past Medical Hx, Surgical Hx, Family Hx, Social Hx, medications and allergies.   Physical Exam   Patient Vitals for the past 24 hrs:  BP Temp Temp src Pulse Resp SpO2 Weight  08/21/17 1311 113/66 98.2 F (36.8 C) Oral 91 16 100 % 126 lb 4 oz (57.3 kg)   Constitutional: Well-developed, well-nourished female in no acute distress.  Cardiovascular: normal rate Respiratory: normal effort GI: Abd soft, non-tender, gravid appropriate for gestational age.  MS: Extremities nontender, no edema, normal ROM Neurologic: Alert and oriented x 4.  GU: Neg CVAT.   Physical Exam  Constitutional: She is oriented to person, place, and time and well-developed, well-nourished, and in no distress.  HENT:  Head: Normocephalic.  Neck: Normal range of motion.  Cardiovascular: Normal rate.  Pulmonary/Chest: Effort  normal.    Abdominal: Soft. There is no tenderness.  Musculoskeletal: Normal range of motion.  Neurological: She is alert and oriented to person, place, and time.  Skin: Skin is warm and dry.  Psychiatric: Mood, affect and judgment normal.  Nursing note and vitals reviewed.   PELVIC EXAM: Cervix pink, visually closed, without lesion, scant white creamy discharge, vaginal walls and external genitalia normal Bimanual exam: Cervix 0/long/high, firm, anterior, neg CMT, uterus nontender, nonenlarged, adnexa without tenderness, enlargement, or mass     FHT:  136 by doppler NST not done per policy since pt not presenting with obstetric complaints.   Labs: No results found for this or any previous visit (from the past 24 hour(s)).    Imaging:  No results found.  MAU Course/MDM: No obstetric complaints so no NST performed per policy FHT wnl [facility], initial OB appt to follow Breast US and aspiration ordered by Dr Adrian BlackwaterStinson yesterday but no imaging ordered.   Called [facility] and earlier appt available but pt needs established provider and prior authorization.  [facility] and we will follow her results.  [facility] can complete prior authorization.   [facility] Continue Bactrim as prescribed Percocet 5/325, take 1 Q 6 hours PRN x 6 tabs Return to ED or MAU with emergencies F/U with prenatal care. Pt discharge with strict infection/pain precautions.  Assessment: 1. Breast abscess during pregnancy, antepartum     Plan: Discharge home Labor precautions and fetal kick counts Follow-up Information    Imaging, [facility] Follow up.   Specialty:  Diagnostic Radiology Why:  Friday, 08/22/17, at 10:30 am for breast imaging. Return to ED for emergencies. Contact information: [address] [place] [address] [phone]        [facility]   Specialty:  Obstetrics and Gynecology Why:  The office will call you with appointment. Contact information: [address] [place] [id number] [phone]         Allergies as of 08/21/2017   No Known Allergies     Medication List    STOP taking these medications   cyclobenzaprine 5 MG tablet Commonly known as:  FLEXERIL     TAKE these medications   oxyCODONE-acetaminophen 5-325 MG tablet Commonly known as:  PERCOCET/ROXICET Take 1 tablet by mouth every 6 (six) hours as needed for severe pain.   sulfamethoxazole-trimethoprim 800-160 MG tablet Commonly known  as:  BACTRIM DS,SEPTRA DS Take 1 tablet by mouth 2 (two) times daily for 7 days.       Sharen Counter Certified Nurse-Midwife 08/21/2017 3:37 PM

## 2017-08-22 ENCOUNTER — Ambulatory Visit
Admission: RE | Admit: 2017-08-22 | Discharge: 2017-08-22 | Disposition: A | Discharge status: Home / Self Care | Payer: Medicaid Other | Source: Ambulatory Visit | Attending: Family Medicine | Admitting: Family Medicine

## 2017-08-22 DIAGNOSIS — N611 Abscess of the breast and nipple: Secondary | ICD-10-CM

## 2017-09-30 ENCOUNTER — Other Ambulatory Visit (HOSPITAL_COMMUNITY)
Admission: RE | Admit: 2017-09-30 | Discharge: 2017-09-30 | Disposition: A | Discharge status: Home / Self Care | Payer: Medicaid Other | Source: Ambulatory Visit | Attending: Medical | Admitting: Medical

## 2017-09-30 ENCOUNTER — Ambulatory Visit (INDEPENDENT_AMBULATORY_CARE_PROVIDER_SITE_OTHER): Payer: Medicaid Other | Admitting: Medical

## 2017-09-30 ENCOUNTER — Ambulatory Visit: Payer: Medicaid Other | Admitting: Clinical

## 2017-09-30 ENCOUNTER — Encounter: Payer: Self-pay | Admitting: Medical

## 2017-09-30 VITALS — BP 129/76 | HR 83 | Wt 131.6 lb

## 2017-09-30 DIAGNOSIS — Z3490 Encounter for supervision of normal pregnancy, unspecified, unspecified trimester: Secondary | ICD-10-CM | POA: Diagnosis present

## 2017-09-30 DIAGNOSIS — Z720 Tobacco use: Secondary | ICD-10-CM

## 2017-09-30 DIAGNOSIS — Z3A00 Weeks of gestation of pregnancy not specified: Secondary | ICD-10-CM | POA: Diagnosis not present

## 2017-09-30 DIAGNOSIS — Z23 Encounter for immunization: Secondary | ICD-10-CM | POA: Diagnosis not present

## 2017-09-30 DIAGNOSIS — Z8759 Personal history of other complications of pregnancy, childbirth and the puerperium: Secondary | ICD-10-CM

## 2017-09-30 DIAGNOSIS — F17210 Nicotine dependence, cigarettes, uncomplicated: Secondary | ICD-10-CM

## 2017-09-30 DIAGNOSIS — O99333 Smoking (tobacco) complicating pregnancy, third trimester: Secondary | ICD-10-CM

## 2017-09-30 DIAGNOSIS — O0933 Supervision of pregnancy with insufficient antenatal care, third trimester: Secondary | ICD-10-CM

## 2017-09-30 DIAGNOSIS — Z8679 Personal history of other diseases of the circulatory system: Secondary | ICD-10-CM

## 2017-09-30 LAB — POCT URINALYSIS DIP (DEVICE)
BILIRUBIN URINE: NEGATIVE
GLUCOSE, UA: NEGATIVE mg/dL
Hgb urine dipstick: NEGATIVE
KETONES UR: NEGATIVE mg/dL
LEUKOCYTES UA: NEGATIVE
NITRITE: NEGATIVE
Protein, ur: 30 mg/dL — AB
Specific Gravity, Urine: 1.015 (ref 1.005–1.030)
Urobilinogen, UA: 1 mg/dL (ref 0.0–1.0)
pH: 7 (ref 5.0–8.0)

## 2017-09-30 MED ORDER — TETANUS-DIPHTH-ACELL PERTUSSIS 5-2.5-18.5 LF-MCG/0.5 IM SUSP
0.5000 mL | Freq: Once | INTRAMUSCULAR | Status: AC
  Administered 2017-09-30: 0.5 mL via INTRAMUSCULAR

## 2017-09-30 MED ORDER — PRENATAL COMPLETE 14-0.4 MG PO TABS: 1.0000 | ORAL_TABLET | Freq: Every day | ORAL | 11 refills | Status: AC

## 2017-09-30 NOTE — Patient Instructions (Signed)
Prenatal Care WHAT IS PRENATAL CARE? Prenatal care is the process of caring for a pregnant woman before she gives birth. Prenatal care makes sure that she and her baby remain as healthy as possible throughout pregnancy. Prenatal care may be provided by a midwife, family practice health care provider, or a childbirth and pregnancy specialist (obstetrician). Prenatal care may include physical examinations, testing, treatments, and education on nutrition, lifestyle, and social support services. WHY IS PRENATAL CARE SO IMPORTANT? Early and consistent prenatal care increases the chance that you and your baby will remain healthy throughout your pregnancy. This type of care also decreases a baby's risk of being born too early (prematurely), or being born smaller than expected (small for gestational age). Any underlying medical conditions you may have that could pose a risk during your pregnancy are discussed during prenatal care visits. You will also be monitored regularly for any new conditions that may arise during your pregnancy so they can be treated quickly and effectively. WHAT HAPPENS DURING PRENATAL CARE VISITS? Prenatal care visits may include the following: Discussion Tell your health care provider about any new signs or symptoms you have experienced since your last visit. These might include:  Nausea or vomiting.  Increased or decreased level of energy.  Difficulty sleeping.  Back or leg pain.  Weight changes.  Frequent urination.  Shortness of breath with physical activity.  Changes in your skin, such as the development of a rash or itchiness.  Vaginal discharge or bleeding.  Feelings of excitement or nervousness.  Changes in your baby's movements.  You may want to write down any questions or topics you want to discuss with your health care provider and bring them with you to your appointment. Examination During your first prenatal care visit, you will likely have a complete  physical exam. Your health care provider will often examine your vagina, cervix, and the position of your uterus, as well as check your heart, lungs, and other body systems. As your pregnancy progresses, your health care provider will measure the size of your uterus and your baby's position inside your uterus. He or she may also examine you for early signs of labor. Your prenatal visits may also include checking your blood pressure and, after about 10-12 weeks of pregnancy, listening to your baby's heartbeat. Testing Regular testing often includes:  Urinalysis. This checks your urine for glucose, protein, or signs of infection.  Blood count. This checks the levels of white and red blood cells in your body.  Tests for sexually transmitted infections (STIs). Testing for STIs at the beginning of pregnancy is routinely done and is required in many states.  Antibody testing. You will be checked to see if you are immune to certain illnesses, such as rubella, that can affect a developing fetus.  Glucose screen. Around 24-28 weeks of pregnancy, your blood glucose level will be checked for signs of gestational diabetes. Follow-up tests may be recommended.  Group B strep. This is a bacteria that is commonly found inside a woman's vagina. This test will inform your health care provider if you need an antibiotic to reduce the amount of this bacteria in your body prior to labor and childbirth.  Ultrasound. Many pregnant women undergo an ultrasound screening around 18-20 weeks of pregnancy to evaluate the health of the fetus and check for any developmental abnormalities.  HIV (human immunodeficiency virus) testing. Early in your pregnancy, you will be screened for HIV. If you are at high risk for HIV, this test may   be repeated during your third trimester of pregnancy.  You may be offered other testing based on your age, personal or family medical history, or other factors. HOW OFTEN SHOULD I PLAN TO SEE MY  HEALTH CARE PROVIDER FOR PRENATAL CARE? Your prenatal care check-up schedule depends on any medical conditions you have before, or develop during, your pregnancy. If you do not have any underlying medical conditions, you will likely be seen for checkups:  Monthly, during the first 6 months of pregnancy.  Twice a month during months 7 and 8 of pregnancy.  Weekly starting in the 9th month of pregnancy and until delivery.  If you develop signs of early labor or other concerning signs or symptoms, you may need to see your health care provider more often. Ask your health care provider what prenatal care schedule is best for you. WHAT CAN I DO TO KEEP MYSELF AND MY BABY AS HEALTHY AS POSSIBLE DURING MY PREGNANCY?  Take a prenatal vitamin containing 400 micrograms (0.4 mg) of folic acid every day. Your health care provider may also ask you to take additional vitamins such as iodine, vitamin D, iron, copper, and zinc.  Take 1500-2000 mg of calcium daily starting at your 20th week of pregnancy until you deliver your baby.  Make sure you are up to date on your vaccinations. Unless directed otherwise by your health care provider: ? You should receive a tetanus, diphtheria, and pertussis (Tdap) vaccination between the 27th and 36th week of your pregnancy, regardless of when your last Tdap immunization occurred. This helps protect your baby from whooping cough (pertussis) after he or she is born. ? You should receive an annual inactivated influenza vaccine (IIV) to help protect you and your baby from influenza. This can be done at any point during your pregnancy.  Eat a well-rounded diet that includes: ? Fresh fruits and vegetables. ? Lean proteins. ? Calcium-rich foods such as milk, yogurt, hard cheeses, and dark, leafy greens. ? Whole grain breads.  Do noteat seafood high in mercury, including: ? Swordfish. ? Tilefish. ? Shark. ? King mackerel. ? More than 6 oz tuna per week.  Do not  eat: ? Raw or undercooked meats or eggs. ? Unpasteurized foods, such as soft cheeses (brie, blue, or feta), juices, and milks. ? Lunch meats. ? Hot dogs that have not been heated until they are steaming.  Drink enough water to keep your urine clear or pale yellow. For many women, this may be 10 or more 8 oz glasses of water each day. Keeping yourself hydrated helps deliver nutrients to your baby and may prevent the start of pre-term uterine contractions.  Do not use any tobacco products including cigarettes, chewing tobacco, or electronic cigarettes. If you need help quitting, ask your health care provider.  Do not drink beverages containing alcohol. No safe level of alcohol consumption during pregnancy has been determined.  Do not use any illegal drugs. These can harm your developing baby or cause a miscarriage.  Ask your health care provider or pharmacist before taking any prescription or over-the-counter medicines, herbs, or supplements.  Limit your caffeine intake to no more than 200 mg per day.  Exercise. Unless told otherwise by your health care provider, try to get 30 minutes of moderate exercise most days of the week. Do not  do high-impact activities, contact sports, or activities with a high risk of falling, such as horseback riding or downhill skiing.  Get plenty of rest.  Avoid anything that raises your  body temperature, such as hot tubs and saunas.  If you own a cat, do not empty its litter box. Bacteria contained in cat feces can cause an infection called toxoplasmosis. This can result in serious harm to the fetus.  Stay away from chemicals such as insecticides, lead, mercury, and cleaning or paint products that contain solvents.  Do not have any X-rays taken unless medically necessary.  Take a childbirth and breastfeeding preparation class. Ask your health care provider if you need a referral or recommendation.  This information is not intended to replace advice given  to you by your health care provider. Make sure you discuss any questions you have with your health care provider. Document Released: 01/24/2003 Document Revised: 06/26/2015 Document Reviewed: 04/07/2013 Elsevier Interactive Patient Education  2017 [place] Education Options: [facility] Department Classes:  Childbirth education classes can help you get ready for a positive parenting experience. You can also meet other expectant parents and get free stuff for your baby. Each class runs for five weeks on the same night and costs $45 for the mother-to-be and her support person. Medicaid covers the cost if you are eligible. Call [phone] to register. [facility] Childbirth Education:  [phone] or [phone] or [email or web address]  Baby & Me Class: Discuss newborn & infant parenting and family adjustment issues with other new mothers in a relaxed environment. Each week brings a new speaker or baby-centered activity. We encourage new mothers to join [place] every Thursday at 11:00am. Babies birth until crawling. No registration or fee. Daddy [employer]: This course offers Dads-to-be the tools and knowledge needed to feel confident on their journey to becoming new fathers. Experienced dads, who have been trained as coaches, teach dads-to-be how to hold, comfort, diaper, swaddle and play with their infant while being able to support the new mom as well. A class for men taught by men. $25/dad Big Brother/Big Sister: Let your children share in the joy of a new brother or sister in this special class designed just for them. Class includes discussion about how families care for babies: swaddling, holding, diapering, safety as well as how they can be helpful in their new role. This class is designed for children ages 43 to 60, but any age is welcome. Please register each child individually. $5/child  Mom Talk: This mom-led group offers support and connection to mothers as  they journey through the adjustments and struggles of that sometimes overwhelming first year after the birth of a child. Tuesdays at 10:00am and Thursdays at 6:00pm. Babies welcome. No registration or fee. Breastfeeding Support Group: This group is a mother-to-mother support circle where moms have the opportunity to share their breastfeeding experiences. A Lactation Consultant is present for questions and concerns. Meets each Tuesday at 11:00am. No fee or registration. Breastfeeding Your Baby: Learn what to expect in the first days of breastfeeding your newborn.  This class will help you feel more confident with the skills needed to begin your breastfeeding experience. Many new mothers are concerned about breastfeeding after leaving the hospital. This class will also address the most common fears and challenges about breastfeeding during the first few weeks, months and beyond. (call for fee) Comfort Techniques and Tour: This 2 hour interactive class will provide you the opportunity to learn & practice hands-on techniques that can help relieve some of the discomfort of labor and encourage your baby to rotate toward the best position for birth. You and your partner will be able to try  a variety of labor positions with birth balls and rebozos as well as practice breathing, relaxation, and visualization techniques. A tour of the [facility] is included with this class. $20 per registrant and support person Childbirth Class- Weekend Option: This class is a Weekend version of our Birth & Baby series. It is designed for parents who have a difficult time fitting several weeks of classes into their schedule. It covers the care of your newborn and the basics of labor and childbirth. It also includes a [place] [facility] and lunch. The class is held two consecutive days: beginning on Friday evening from 6:30 - 8:30 p.m. and the next day, Saturday from 9 a.m. - 4 p.m.  (call for fee) Doren Custard Class: Interested in a waterbirth?  This informational class will help you discover whether waterbirth is the right fit for you. Education about waterbirth itself, supplies you would need and how to assemble your support team is what you can expect from this class. Some obstetrical practices require this class in order to pursue a waterbirth. (Not all obstetrical practices offer waterbirth-check with your healthcare provider.) Register only the expectant mom, but you are encouraged to bring your partner to class! Required if planning waterbirth, no fee. Infant/Child CPR: Parents, grandparents, babysitters, and friends learn Cardio-Pulmonary Resuscitation skills for infants and children. You will also learn how to treat both conscious and unconscious choking in infants and children. This Family & Friends program does not offer certification. Register each participant individually to ensure that enough mannequins are available. (Call for fee) Grandparent Love: Expecting a grandbaby? This class is for you! Learn about the latest infant care and safety recommendations and ways to support your own child as he or she transitions into the parenting role. Taught by Registered Nurses who are childbirth instructors, but most importantly...they are grandmothers too! $10/person. Childbirth Class- Natural Childbirth: This series of 5 weekly classes is for expectant parents who want to learn and practice natural methods of coping with the process of labor and childbirth. Relaxation, breathing, massage, visualization, role of the partner, and helpful positioning are highlighted. Participants learn how to be confident in their body's ability to give birth. This class will empower and help parents make informed decisions about their own care. Includes discussion that will help new parents transition into the immediate postpartum period. [facility] is included. We  suggest taking this class between 25-32 weeks, but it's only a recommendation. $75 per registrant and one support person or $30 Medicaid. Childbirth Class- 3 week Series: This option of 3 weekly classes helps you and your labor partner prepare for childbirth. Newborn care, labor & birth, cesarean birth, pain management, and comfort techniques are discussed and a [place] [facility] is included. The class meets at the same time, on the same day of the week for 3 consecutive weeks beginning with the starting date you choose. $60 for registrant and one support person.  Marvelous Multiples: Expecting twins, triplets, or more? This class covers the differences in labor, birth, parenting, and breastfeeding issues that face multiples' parents. NICU tour is included. Led by a Certified Childbirth Educator who is the mother of twins. No fee. Caring for Baby: This class is for expectant and adoptive parents who want to learn and practice the most up-to-date newborn care for their babies. Focus is on birth through the first six weeks of life. Topics include feeding, bathing, diapering,  crying, umbilical cord care, circumcision care and safe sleep. Parents learn to recognize symptoms of illness and when to call the pediatrician. Register only the mom-to-be and your partner or support person can plan to come with you! $10 per registrant and support person Childbirth Class- online option: This online class offers you the freedom to complete a Birth and Baby series in the comfort of your own home. The flexibility of this option allows you to review sections at your own pace, at times convenient to you and your support people. It includes additional video information, animations, quizzes, and extended activities. Get organized with helpful eClass tools, checklists, and trackers. Once you register online for the class, you will receive an email within a few days to accept the invitation and begin the  class when the time is right for you. The content will be available to you for 60 days. $60 for 60 days of online access for you and your support people.  Local Doulas: Natural Baby Doulas [email or web address] Tel: [phone] [email or web address] [employer] [phone] [email or web address] The Labor Hassell Halim  (also do waterbirth tub rental) [phone] [email or web address] Triad Birth Doula [phone] [email or web address] NotebookDistributors.fi Sacred Rhythms  [phone] https://[email or web address]-[email or web address]/ [employer] (PADA) [email or web address]/ [place] Birth and Baby  [email or web address]/ Considering Waterbirth? Guide for patients at [facility] [employer]  Why consider waterbirth?  . Gentle birth for babies . Less pain medicine used in labor . May allow for passive descent/less pushing . May reduce perineal tears  . More mobility and instinctive maternal position changes . Increased maternal relaxation . Reduced blood pressure in labor  Is waterbirth safe? What are the risks of infection, drowning or other complications?  . Infection: o Very low risk (3.7 % for tub vs 4.8% for bed) o 7 in 8000 waterbirths with documented infection o Poorly cleaned equipment most common cause o Slightly lower group B strep transmission rate  . Drowning o Maternal:  - Very low risk   - Related to seizures or fainting o Newborn:  - Very low risk. No evidence of increased risk of respiratory problems in multiple large studies - Physiological protection from breathing under water - Avoid underwater birth if there are any fetal complications - Once baby's head is out of the water, keep it out.  . Birth complication o Some reports of cord trauma, but risk decreased by bringing baby to surface  gradually o No evidence of increased risk of shoulder dystocia. Mothers can usually change positions faster in water than in a bed, possibly aiding the maneuvers to free the shoulder.   You must attend a Doren Custard class at [facility]  3rd Wednesday of every month from 7-9pm  [employer] by calling [phone] or online at [email or web address]  Bring [place] the certificate from the class to your prenatal appointment  Meet with a midwife at 36 weeks to see if you can still plan a waterbirth and to sign the consent.   Purchase or rent the following supplies:   Water Birth Pool (Birth Pool in a Box or [place] for instance)  (Tubs start ~$125)  Single-use disposable tub liner designed for your brand of tub  New garden hose labeled "lead-free", "suitable for drinking water",  Electric drain pump to remove water (We recommend 792 gallon per hour or greater pump.)   Separate garden hose to remove the dirty water  Fish net  Bathing suit top (optional)  Long-handled mirror (optional)  Places to purchase or rent supplies  GotWebTools.is for tub purchases and supplies  Waterbirthsolutions.com for tub purchases and supplies  The Labor Ladies (www.[email or web address]) $275 for tub rental/set-up & take down/kit   [employer] (http://www.fleming.com/.htm) Information regarding doulas (labor support) who provide pool rentals  Our practice has a Birth Pool in a Box tub at the hospital that you may borrow on a first-come-first-served basis. It is your responsibility to to set up, clean and break down the tub. We cannot guarantee the availability of this tub in advance. You are responsible for bringing all accessories listed above. If you do not have all necessary supplies you cannot have a waterbirth.    Things that would prevent you from having a waterbirth:  Premature, <37wks  Previous cesarean birth  Presence of thick meconium-stained  fluid  Multiple gestation (Twins, triplets, etc.)  Uncontrolled diabetes or gestational diabetes requiring medication  Hypertension requiring medication or diagnosis of pre-eclampsia  Heavy vaginal bleeding  Non-reassuring fetal heart rate  Active infection (MRSA, etc.). Group B Strep is NOT a contraindication for  waterbirth.  If your labor has to be induced and induction method requires continuous  monitoring of the baby's heart rate  Other risks/issues identified by your obstetrical provider  Please remember that birth is unpredictable. Under certain unforeseeable circumstances your provider may advise against giving birth in the tub. These decisions will be made on a case-by-case basis and with the safety of you and your baby as our highest priority.     AREA PEDIATRIC/FAMILY [place] [address] [place], [place]  [address] Phone - [phone]   Fax - [phone]  [facility] [place] [address] [place], [place] [address] Phone - [phone]   Fax - [place] [address], [place]  [address] Phone - [phone]   Fax - [phone]  [place] [place] [address] [place], [place]  [address] Phone - [phone]   Fax - [phone]  [place] [address] [place], [place]  [address] Phone - [phone]   Fax - [phone]  [facility] [address] [place], [place]  [address] Phone - [phone]   Fax - [place] [address], [place], [place]  [address] Phone - [phone]   Fax - [phone]  [place] [address] [place], [place] [address] [place], [place]  [address] Phone - [phone]   Fax - [place] [address] [place], [place]  [address] Phone - [phone]    Fax - [phone] [facility] [place]. [address] [place], [place]  [address] Phone - [phone]   Fax - [phone]  [place] [address] [place], [place]  [address] Phone - [phone]   Fax - [phone]  [facility] [place], [place], [place]  [address] Phone - [phone]   Fax - [place] [address] [place], [place]  [address] Phone - [phone]   Fax - [phone]  [facility] [address], [place], [place]  [address] Phone - [place], [place]  [address] Phone - [phone]   Fax - [place] [address], [place], [place]  [address] Phone -  [phone]   Fax - [phone]  [place] [address] [place], [place]  [address] Phone - [phone]   Fax - [place], [place]  [address] Phone - [phone]   Fax - [place], [place], [place]  [address] Phone - [phone]   Fax - [place] [address], [place], [place]  [address] Phone - [phone]   Fax - [phone]  [place] [address], [place], [place]  [address] Phone - [phone]   Fax - [place] [address], [place], [place]  [address] Phone - [phone]   Fax - [phone]  [place] [address] [place], [place]  [address] Phone - [phone]   Fax - [phone] [place] [address] [place], [place]  [address] Phone - [phone]   Fax - [place] [address] [place], [place]  [address] Phone - [phone]   Fax -  [place] [address], [place], [place]  [address] Phone - [phone]   Fax - [phone]  [place] [address] [place] [address] Phone [phone]  Fax [phone]  Safe Medications in Pregnancy   Acne:  Benzoyl Peroxide  Salicylic Acid   Backache/Headache:  Tylenol: 2 regular strength every 4 hours OR        2 Extra strength every 6 hours   Colds/Coughs/Allergies:  Benadryl (alcohol free) 25 mg every 6 hours as needed  Breath right strips  Claritin  Cepacol throat lozenges  Chloraseptic throat spray  Cold-Eeze- up to three times per day  Cough drops, alcohol free  Flonase (by prescription only)  Guaifenesin  Mucinex  Robitussin DM (plain only, alcohol free)  Saline nasal spray/drops  Sudafed (pseudoephedrine) & Actifed * use only after [redacted] weeks gestation and if you do not have high blood pressure  Tylenol  Vicks Vaporub  Zinc lozenges  Zyrtec   Constipation:  Colace  Ducolax suppositories  Fleet enema  Glycerin suppositories  Metamucil  Milk of magnesia  Miralax  Senokot  Smooth move tea   Diarrhea:  Kaopectate  Imodium A-D   *NO pepto Bismol   Hemorrhoids:  Anusol  Anusol HC  Preparation H  Tucks   Indigestion:  Tums  Maalox  Mylanta  Zantac  Pepcid   Insomnia:  Benadryl (alcohol free) 5m every 6 hours as needed  Tylenol PM  Unisom, no Gelcaps   Leg Cramps:  Tums  MagGel   Nausea/Vomiting:  Bonine  Dramamine  Emetrol  Ginger extract  Sea bands  Meclizine  Nausea medication to take during pregnancy:  Unisom (doxylamine succinate 25 mg tablets) Take one tablet daily at bedtime. If symptoms are not adequately controlled, the dose can be increased to a maximum recommended dose of two tablets daily (1/2 tablet in the morning, 1/2 tablet mid-afternoon and one at bedtime).  Vitamin B6 1085mtablets.  Take one tablet twice a day (up to 200 mg per day).   Skin Rashes:  Aveeno products  Benadryl cream or 2543mvery 6 hours as needed  Calamine Lotion  1% cortisone cream   Yeast infection:  Gyne-lotrimin 7  Monistat 7    **If taking multiple medications, please check labels to avoid  duplicating the same active ingredients  **take medication as directed on the label  ** Do not exceed 4000 mg of tylenol in 24 hours  **Do not take medications that contain aspirin or ibuprofen          Places to have your son circumcised:    [facility] [phone] $480 while you are in hospital  [facility] [phone] $244 by 4 wks  [facility] [phone] $175 by 2 wks  [facility] [phone] $250 by 7 days [facility] [phone] $[phone] by 4 wks  These prices sometimes change but are roughly what you can expect to pay. Please call and confirm pricing.   Circumcision is considered an elective/non-medically necessary procedure. There are many reasons parents decide to have their sons circumsized. During the first year of life circumcised males have a reduced risk of urinary tract infections but after this year the rates between circumcised males and uncircumcised males are the same.  It is safe to have your son circumcised outside of the hospital and the places above perform them regularly.   Deciding about Circumcision in Baby Boys  (Up-to-date The Basics)  What is circumcision?  Circumcision is a surgery that removes the skin that covers the tip of the penis, called the "foreskin" Circumcision is usually done when a boy is between 46 and 35 days old. In the [place], circumcision is common. In some other countries, fewer boys are circumcised. Circumcision is a common tradition in some religions.  Should I have my baby boy  circumcised?  There is no easy answer. Circumcision has some benefits. But it also has risks. After talking with your doctor, you will have to decide for yourself what is right for your family.  What are the benefits of circumcision?  Circumcised boys seem to have slightly lower rates of: ?Urinary tract infections ?Swelling of the opening at the tip of the penis Circumcised men seem to have slightly lower rates of: ?Urinary tract infections ?Swelling of the opening at the tip of the penis ?Penis cancer ?HIV and other infections that you catch during sex ?Cervical cancer in the women they have sex with Even so, in the [place], the risks of these problems are small - even in boys and men who have not been circumcised. Plus, boys and men who are not circumcised can reduce these extra risks by: ?Cleaning their penis well ?Using condoms during sex  What are the risks of circumcision?  Risks include: ?Bleeding or infection from the surgery ?Damage to or amputation of the penis ?A chance that the doctor will cut off too much or not enough of the foreskin ?A chance that sex won't feel as good later in life Only about 1 out of every 200 circumcisions leads to problems. There is also a chance that your health insurance won't pay for circumcision.  How is circumcision done in baby boys?  First, the baby gets medicine for pain relief. This might be a cream on the skin or a shot into the base of the penis. Next, the doctor cleans the baby's penis well. Then he or she uses special tools to cut off the foreskin. Finally, the doctor wraps a bandage (called gauze) around the baby's penis. If you have your baby circumcised, his doctor or nurse will give you instructions on how to care for him after the surgery. It is important that you follow those instructions carefully.

## 2017-09-30 NOTE — Progress Notes (Signed)
US scheduled Thursday 8/29 @1045 . Pt notified. BTL consent signed. Tdap given right deltoid. [facility] Dr. Ambrose MantleHenley.

## 2017-09-30 NOTE — Progress Notes (Signed)
   PRENATAL VISIT NOTE  Subjective:  Everlean AlstromMiranda M Enamorado is a 27 y.o. [id number] at 669w3d being seen today for her first prenatal visit.  She is currently monitored for the following issues for this high-risk pregnancy and has History of herpes simplex infection; Marijuana use; Tobacco use; Supervision of low-risk pregnancy; Late prenatal care affecting pregnancy, antepartum, third trimester; and History of postpartum hypertension on their problem list.  Patient reports no complaints.  Contractions: Irritability. Vag. Bleeding: None.  Movement: Present. Denies leaking of fluid.   The following portions of the patient's history were reviewed and updated as appropriate: allergies, current medications, past family history, past medical history, past social history, past surgical history and problem list. Problem list updated.  Objective:   Vitals:   09/30/17 1356  BP: 129/76  Pulse: 83  Weight: 131 lb 9.6 oz (59.7 kg)    Fetal Status: Fetal Heart Rate (bpm): 140 Fundal Height: 31 cm Movement: Present     General:  Alert, oriented and cooperative. Patient is in no acute distress.  Skin: Skin is warm and dry. No rash noted.   Cardiovascular: Normal heart rate and rhythm noted  Respiratory: Normal respiratory effort, no problems with respiration noted. Clear to auscultation   Abdomen: Soft, gravid, appropriate for gestational age. Normal bowel sounds. No tenderness to palpation   Pain/Pressure: Present     Pelvic: Cervical exam deferred        Extremities: Normal range of motion.  Edema: Trace  Mental Status: Normal mood and affect. Normal behavior. Normal judgment and thought content.   Assessment and Plan:  Pregnancy: G5P4004 at 4469w3d  1. Encounter for supervision of low-risk pregnancy, antepartum - [facility] AMB BABYSCRIPTS OPT IN - Culture, OB Urine - Cystic fibrosis gene test - GC/Chlamydia probe amp ([place])not at [facility] - Genetic Screening - Hemoglobinopathy Evaluation - Obstetric  Panel, Including HIV - SMN1 COPY NUMBER ANALYSIS (SMA Carrier Screen) - US MFM OB DETAIL +14 WK; Future - Glucose Tolerance, 1 Hour - Tdap (BOOSTRIX) injection 0.5 mL - Prenatal Vit-Fe Fumarate-FA (PRENATAL COMPLETE) 14-0.4 MG TABS; Take 1 tablet by mouth daily.  Dispense: 60 each; Refill: 11  2. Late prenatal care affecting pregnancy, antepartum, third trimester - US MFM OB DETAIL +14 WK; scheduled  3. Tobacco use - Quitting encouraged, discussed progressively reducing tobacco use as safest method during pregnancy - Smoking and tobacco cessation was discussed at today's visit for 3 minutes  - US MFM OB DETAIL +14 WK; scheduled   4. History of postpartum hypertension - [facility] - Discharged on Procardia, unclear if patient had any further follow-up - Normotensive today   Preterm labor symptoms and general obstetric precautions including but not limited to vaginal bleeding, contractions, leaking of fluid and fetal movement were reviewed in detail with the patient. Please refer to After Visit Summary for other counseling recommendations.  Return in about 2 weeks (around 10/14/2017) for LOB.  Future Appointments  Date Time Provider Department Center  10/02/2017 10:45 AM [facility] US 5 [facility]    Vonzella NippleJulie Minyon Billiter, PA-C

## 2017-09-30 NOTE — BH Specialist Note (Signed)
Integrated Behavioral Health Initial Visit  MRN: [id number] Name: Loretta AlstromMiranda M Hipp  Number of Integrated Behavioral Health Clinician visits:: 1/6 Session Start time: 2:50  Session End time: 3:02 Total time: 15 minutes  Type of Service: Integrated Behavioral Health- Individual/Family Interpretor:No. Interpretor Name and Language: n/a   Warm Hand Off Completed.       SUBJECTIVE: [name] is a 27 y.o. female accompanied by n/a Patient was referred by Vonzella NippleJulie Wenzel, PA-C for initial OB introduction to integrated behavioral health services . Patient reports the following symptoms/concerns: Pt states no particular concerns today. Duration of problem: n/a; Severity of problem: n/a  OBJECTIVE: Mood: Normal and Affect: Appropriate Risk of harm to self or others: No plan to harm self or others  LIFE CONTEXT: Family and Social: Pt lives with her four children School/Work: Pt works two jobs Self-Care: - Life Changes: Current pregnancy   GOALS ADDRESSED:n/a INTERVENTIONS:  Standardized Assessments completed: GAD-7 and PHQ 9  ASSESSMENT: Patient currently experiencing Supervision of low-risk pregnancy, antepartum   Patient may benefit from Initial OB introduction to integrated behavioral health services  PLAN: 1. Follow up with behavioral health clinician on : As needed  2. Behavioral recommendations:   3. Referral(s): Integrated Behavioral Health Services (In Clinic) 4. "From scale of 1-10, how likely are you to follow plan?": -  Rae LipsJamie C Raia Amico, LCSW   Dep[facility] screen [facility] 2/[facility] 09/30/2017 09/23/2016 08/21/2016  Decreased Interest 0 0 3  Down, Depressed, Hopeless 0 0 0  PHQ - 2 Score 0 0 3  Altered sleeping 0 3 3  Tired, decreased energy 0 0 3  Change in appetite 0 2 0  Feeling bad or failure about yourself  0 0 0  Trouble concentrating 0 0 0  Moving slowly or fidgety/restless 0 0 0  Suicidal thoughts 0 0 0  PHQ-9 Score 0 5 9   GAD 7 : Generalized Anxiety Score  09/30/2017 09/23/2016 08/21/2016  Nervous, Anxious, on Edge 0 0 0  Control/stop worrying 0 0 0  Worry too much - different things 0 0 0  Trouble relaxing 0 0 3  Restless 0 0 0  Easily annoyed or irritable 0 0 3  Afraid - awful might happen 0 0 0  Total GAD 7 Score 0 0 6

## 2017-10-01 LAB — GC/CHLAMYDIA PROBE AMP (~~LOC~~) NOT AT ARMC
CHLAMYDIA, DNA PROBE: NEGATIVE
NEISSERIA GONORRHEA: NEGATIVE

## 2017-10-02 ENCOUNTER — Ambulatory Visit (HOSPITAL_COMMUNITY)
Admission: RE | Admit: 2017-10-02 | Discharge: 2017-10-02 | Disposition: A | Discharge status: Home / Self Care | Payer: Medicaid Other | Source: Ambulatory Visit | Attending: Medical | Admitting: Medical

## 2017-10-02 ENCOUNTER — Encounter (HOSPITAL_COMMUNITY): Payer: Self-pay

## 2017-10-02 DIAGNOSIS — O0933 Supervision of pregnancy with insufficient antenatal care, third trimester: Secondary | ICD-10-CM | POA: Diagnosis not present

## 2017-10-02 DIAGNOSIS — Z720 Tobacco use: Secondary | ICD-10-CM | POA: Diagnosis not present

## 2017-10-02 DIAGNOSIS — Z362 Encounter for other antenatal screening follow-up: Secondary | ICD-10-CM | POA: Diagnosis present

## 2017-10-02 DIAGNOSIS — O09293 Supervision of pregnancy with other poor reproductive or obstetric history, third trimester: Secondary | ICD-10-CM

## 2017-10-02 DIAGNOSIS — Z363 Encounter for antenatal screening for malformations: Secondary | ICD-10-CM | POA: Insufficient documentation

## 2017-10-02 DIAGNOSIS — O99330 Smoking (tobacco) complicating pregnancy, unspecified trimester: Secondary | ICD-10-CM | POA: Diagnosis not present

## 2017-10-02 DIAGNOSIS — Z3A30 30 weeks gestation of pregnancy: Secondary | ICD-10-CM | POA: Diagnosis not present

## 2017-10-02 DIAGNOSIS — Z3490 Encounter for supervision of normal pregnancy, unspecified, unspecified trimester: Secondary | ICD-10-CM

## 2017-10-02 LAB — URINE CULTURE, OB REFLEX: Organism ID, Bacteria: NO GROWTH

## 2017-10-02 LAB — CULTURE, OB URINE

## 2017-10-08 ENCOUNTER — Encounter: Payer: Self-pay | Admitting: *Deleted

## 2017-10-10 ENCOUNTER — Encounter: Payer: Self-pay | Admitting: *Deleted

## 2017-10-10 LAB — SMN1 COPY NUMBER ANALYSIS (SMA CARRIER SCREENING)

## 2017-10-10 LAB — OBSTETRIC PANEL, INCLUDING HIV
Antibody Screen: NEGATIVE
BASOS ABS: 0 10*3/uL (ref 0.0–0.2)
Basos: 0 %
EOS (ABSOLUTE): 0.2 10*3/uL (ref 0.0–0.4)
Eos: 2 %
HIV Screen 4th Generation wRfx: NONREACTIVE
Hematocrit: 29.4 % — ABNORMAL LOW (ref 34.0–46.6)
Hemoglobin: 8.9 g/dL — ABNORMAL LOW (ref 11.1–15.9)
Hepatitis B Surface Ag: NEGATIVE
IMMATURE GRANULOCYTES: 1 %
Immature Grans (Abs): 0 10*3/uL (ref 0.0–0.1)
Lymphocytes Absolute: 2.1 10*3/uL (ref 0.7–3.1)
Lymphs: 24 %
MCH: 24.8 pg — ABNORMAL LOW (ref 26.6–33.0)
MCHC: 30.3 g/dL — AB (ref 31.5–35.7)
MCV: 82 fL (ref 79–97)
Monocytes Absolute: 0.6 10*3/uL (ref 0.1–0.9)
Monocytes: 8 %
NEUTROS PCT: 65 %
Neutrophils Absolute: 5.6 10*3/uL (ref 1.4–7.0)
Platelets: 338 10*3/uL (ref 150–450)
RBC: 3.59 x10E6/uL — ABNORMAL LOW (ref 3.77–5.28)
RDW: 14.7 % (ref 12.3–15.4)
RPR Ser Ql: NONREACTIVE
Rh Factor: POSITIVE
Rubella Antibodies, IGG: 9.04 index (ref >0.99)
WBC: 8.5 10*3/uL (ref 3.4–10.8)

## 2017-10-10 LAB — GLUCOSE TOLERANCE, 1 HOUR: Glucose, 1Hr PP: 64 mg/dL — ABNORMAL LOW (ref 65–199)

## 2017-10-10 LAB — HEMOGLOBINOPATHY EVALUATION
Ferritin: 4 ng/mL — ABNORMAL LOW (ref 15–150)
HGB C: 0 %
HGB S: 0 %
HGB VARIANT: 0 %
Hgb A2 Quant: 2 % (ref 1.8–3.2)
Hgb A: 98 % (ref 96.4–98.8)
Hgb F Quant: 0 % (ref 0.0–2.0)
Hgb Solubility: NEGATIVE

## 2017-10-10 LAB — CYSTIC FIBROSIS GENE TEST

## 2017-10-13 ENCOUNTER — Encounter: Payer: Self-pay | Admitting: *Deleted

## 2017-10-27 ENCOUNTER — Encounter: Payer: Self-pay | Admitting: *Deleted

## 2017-10-30 ENCOUNTER — Encounter: Payer: Self-pay | Admitting: *Deleted

## 2017-11-10 ENCOUNTER — Other Ambulatory Visit (HOSPITAL_COMMUNITY)
Admission: RE | Admit: 2017-11-10 | Discharge: 2017-11-10 | Disposition: A | Discharge status: Home / Self Care | Payer: Medicaid Other | Source: Ambulatory Visit | Attending: Advanced Practice Midwife | Admitting: Advanced Practice Midwife

## 2017-11-10 ENCOUNTER — Encounter: Payer: Self-pay | Admitting: Advanced Practice Midwife

## 2017-11-10 ENCOUNTER — Ambulatory Visit (INDEPENDENT_AMBULATORY_CARE_PROVIDER_SITE_OTHER): Payer: Medicaid Other | Admitting: Advanced Practice Midwife

## 2017-11-10 DIAGNOSIS — Z3493 Encounter for supervision of normal pregnancy, unspecified, third trimester: Secondary | ICD-10-CM | POA: Insufficient documentation

## 2017-11-10 DIAGNOSIS — Z3490 Encounter for supervision of normal pregnancy, unspecified, unspecified trimester: Secondary | ICD-10-CM

## 2017-11-10 DIAGNOSIS — O0933 Supervision of pregnancy with insufficient antenatal care, third trimester: Secondary | ICD-10-CM | POA: Diagnosis not present

## 2017-11-10 MED ORDER — VALACYCLOVIR HCL 1 G PO TABS: 1000.0000 mg | ORAL_TABLET | Freq: Every day | ORAL | 1 refills | Status: AC

## 2017-11-10 NOTE — Progress Notes (Signed)
   PRENATAL VISIT NOTE  Subjective:  [name] is a 27 y.o. [id number] at [redacted]w[redacted]d being seen today for ongoing prenatal care.  She is currently monitored for the following issues for this low-risk pregnancy and has History of herpes simplex infection; Marijuana use; Tobacco use; Supervision of low-risk pregnancy; Late prenatal care affecting pregnancy, antepartum, third trimester; and History of postpartum hypertension on their problem list.  Patient reports thick white vaginal discharge .  Contractions: Irregular. Vag. Bleeding: None.  Movement: Present. Denies leaking of fluid.   The following portions of the patient's history were reviewed and updated as appropriate: allergies, current medications, past family history, past medical history, past social history, past surgical history and problem list. Problem list updated.  Objective:   Vitals:   11/10/17 1550  BP: 119/67  Pulse: 90  Weight: 137 lb 12.8 oz (62.5 kg)    Fetal Status: Fetal Heart Rate (bpm): 155   Movement: Present     General:  Alert, oriented and cooperative. Patient is in no acute distress.  Skin: Skin is warm and dry. No rash noted.   Cardiovascular: Normal heart rate noted  Respiratory: Normal respiratory effort, no problems with respiration noted  Abdomen: Soft, gravid, appropriate for gestational age.  Pain/Pressure: Present     Pelvic: Cervical exam performed        Extremities: Normal range of motion.  Edema: Trace  Mental Status: Normal mood and affect. Normal behavior. Normal judgment and thought content.   Assessment and Plan:  Pregnancy: G5P4004 at [redacted]w[redacted]d  1. Encounter for supervision of low-risk pregnancy, antepartum - Culture, beta strep (group b only) - Cervicovaginal ancillary only  2. Late prenatal care affecting pregnancy, antepartum, third trimester - 2 visits as of today - Patient was >45 min late for her appt today  - Patient has bus passes, and states that she should be able to make her  appointments more easily now.  - Start Valtrex today, rx sent to pharmacy   Preterm labor symptoms and general obstetric precautions including but not limited to vaginal bleeding, contractions, leaking of fluid and fetal movement were reviewed in detail with the patient. Please refer to After Visit Summary for other counseling recommendations.  Return in about 1 week (around 11/17/2017).  No future appointments.  Thressa Sheller, CNM

## 2017-11-11 LAB — CERVICOVAGINAL ANCILLARY ONLY
Bacterial vaginitis: POSITIVE — AB
Candida vaginitis: NEGATIVE
Chlamydia: NEGATIVE
NEISSERIA GONORRHEA: NEGATIVE
Trichomonas: NEGATIVE

## 2017-11-12 ENCOUNTER — Telehealth: Payer: Self-pay

## 2017-11-12 MED ORDER — METRONIDAZOLE 500 MG PO TABS: 500.0000 mg | ORAL_TABLET | Freq: Two times a day (BID) | ORAL | 0 refills | Status: DC

## 2017-11-12 NOTE — Telephone Encounter (Signed)
Per pt's wet prep results showed that she has BV.  Notified pt of results and that Flagyl has been sent to her [facility] pharmacy off [facility].  I explained that she will take it twice a day for seven days.  Pt stated understanding.

## 2017-11-13 LAB — CULTURE, BETA STREP (GROUP B ONLY): Strep Gp B Culture: NEGATIVE

## 2017-11-20 ENCOUNTER — Telehealth: Payer: Self-pay | Admitting: Obstetrics and Gynecology

## 2017-11-20 ENCOUNTER — Encounter: Payer: Self-pay | Admitting: Obstetrics and Gynecology

## 2017-11-20 NOTE — Telephone Encounter (Signed)
Called and left VM for pt in regards to missed appt on 10/17. Was also going to let her know about her upcoming appt on 10/24. Letter mailed

## 2017-11-27 ENCOUNTER — Encounter: Payer: Self-pay | Admitting: Obstetrics and Gynecology

## 2017-11-29 ENCOUNTER — Encounter (HOSPITAL_COMMUNITY): Payer: Self-pay | Admitting: *Deleted

## 2017-11-29 ENCOUNTER — Inpatient Hospital Stay (HOSPITAL_COMMUNITY)
Admission: AD | Admit: 2017-11-29 | Discharge: 2017-12-01 | DRG: 797 | Disposition: A | Discharge status: Home / Self Care | Payer: Medicaid Other | Attending: Obstetrics & Gynecology | Admitting: Obstetrics & Gynecology

## 2017-11-29 DIAGNOSIS — Z302 Encounter for sterilization: Secondary | ICD-10-CM

## 2017-11-29 DIAGNOSIS — O9832 Other infections with a predominantly sexual mode of transmission complicating childbirth: Principal | ICD-10-CM | POA: Diagnosis present

## 2017-11-29 DIAGNOSIS — Z3A39 39 weeks gestation of pregnancy: Secondary | ICD-10-CM

## 2017-11-29 DIAGNOSIS — F1290 Cannabis use, unspecified, uncomplicated: Secondary | ICD-10-CM | POA: Diagnosis present

## 2017-11-29 DIAGNOSIS — F17210 Nicotine dependence, cigarettes, uncomplicated: Secondary | ICD-10-CM | POA: Diagnosis present

## 2017-11-29 DIAGNOSIS — O99324 Drug use complicating childbirth: Secondary | ICD-10-CM | POA: Diagnosis present

## 2017-11-29 DIAGNOSIS — Z23 Encounter for immunization: Secondary | ICD-10-CM | POA: Diagnosis not present

## 2017-11-29 DIAGNOSIS — A6000 Herpesviral infection of urogenital system, unspecified: Secondary | ICD-10-CM | POA: Diagnosis present

## 2017-11-29 DIAGNOSIS — Z3490 Encounter for supervision of normal pregnancy, unspecified, unspecified trimester: Secondary | ICD-10-CM

## 2017-11-29 DIAGNOSIS — O99334 Smoking (tobacco) complicating childbirth: Secondary | ICD-10-CM | POA: Diagnosis present

## 2017-11-29 DIAGNOSIS — Z3483 Encounter for supervision of other normal pregnancy, third trimester: Secondary | ICD-10-CM | POA: Diagnosis present

## 2017-11-29 LAB — TYPE AND SCREEN
ABO/RH(D): O POS
Antibody Screen: NEGATIVE

## 2017-11-29 LAB — CBC
HEMATOCRIT: 27.2 % — AB (ref 36.0–46.0)
Hemoglobin: 8.8 g/dL — ABNORMAL LOW (ref 12.0–15.0)
MCH: 24.4 pg — AB (ref 26.0–34.0)
MCHC: 32.4 g/dL (ref 30.0–36.0)
MCV: 75.6 fL — AB (ref 80.0–100.0)
NRBC: 0 % (ref 0.0–0.2)
Platelets: 321 10*3/uL (ref 150–400)
RBC: 3.6 MIL/uL — AB (ref 3.87–5.11)
RDW: 15.1 % (ref 11.5–15.5)
WBC: 10.1 10*3/uL (ref 4.0–10.5)

## 2017-11-29 LAB — POCT FERN TEST: POCT FERN TEST: NEGATIVE

## 2017-11-29 MED ORDER — PHENYLEPHRINE 40 MCG/ML (10ML) SYRINGE FOR IV PUSH (FOR BLOOD PRESSURE SUPPORT)
80.0000 ug | PREFILLED_SYRINGE | INTRAVENOUS | Status: DC | PRN
  Filled 2017-11-29: qty 5

## 2017-11-29 MED ORDER — IBUPROFEN 600 MG PO TABS
600.0000 mg | ORAL_TABLET | Freq: Four times a day (QID) | ORAL | Status: DC
  Administered 2017-11-29 – 2017-12-01 (×7): 600 mg via ORAL
  Filled 2017-11-29 (×8): qty 1

## 2017-11-29 MED ORDER — ONDANSETRON HCL 4 MG/2ML IJ SOLN: 4.0000 mg | Freq: Four times a day (QID) | INTRAMUSCULAR | Status: DC | PRN

## 2017-11-29 MED ORDER — PREPLUS 27-1 MG PO TABS: 1.0000 | ORAL_TABLET | Freq: Every day | ORAL | Status: DC

## 2017-11-29 MED ORDER — FENTANYL 2.5 MCG/ML BUPIVACAINE 1/10 % EPIDURAL INFUSION (WH - ANES)
INTRAMUSCULAR | Status: AC
  Filled 2017-11-29: qty 100

## 2017-11-29 MED ORDER — PHENYLEPHRINE 40 MCG/ML (10ML) SYRINGE FOR IV PUSH (FOR BLOOD PRESSURE SUPPORT)
PREFILLED_SYRINGE | INTRAVENOUS | Status: AC
  Filled 2017-11-29: qty 10

## 2017-11-29 MED ORDER — DIBUCAINE 1 % RE OINT: 1.0000 "application " | TOPICAL_OINTMENT | RECTAL | Status: DC | PRN

## 2017-11-29 MED ORDER — COCONUT OIL OIL: 1.0000 "application " | TOPICAL_OIL | Status: DC | PRN

## 2017-11-29 MED ORDER — ACETAMINOPHEN 325 MG PO TABS: 650.0000 mg | ORAL_TABLET | ORAL | Status: DC | PRN

## 2017-11-29 MED ORDER — LACTATED RINGERS IV SOLN
INTRAVENOUS | Status: DC
  Administered 2017-11-29: 12:00:00 via INTRAVENOUS

## 2017-11-29 MED ORDER — TETANUS-DIPHTH-ACELL PERTUSSIS 5-2.5-18.5 LF-MCG/0.5 IM SUSP: 0.5000 mL | Freq: Once | INTRAMUSCULAR | Status: DC

## 2017-11-29 MED ORDER — FLEET ENEMA 7-19 GM/118ML RE ENEM: 1.0000 | ENEMA | Freq: Every day | RECTAL | Status: DC | PRN

## 2017-11-29 MED ORDER — LACTATED RINGERS IV SOLN: 500.0000 mL | Freq: Once | INTRAVENOUS | Status: DC

## 2017-11-29 MED ORDER — SENNOSIDES-DOCUSATE SODIUM 8.6-50 MG PO TABS
2.0000 | ORAL_TABLET | ORAL | Status: DC
  Administered 2017-11-29 – 2017-11-30 (×2): 2 via ORAL
  Filled 2017-11-29 (×2): qty 2

## 2017-11-29 MED ORDER — DIPHENHYDRAMINE HCL 25 MG PO CAPS: 25.0000 mg | ORAL_CAPSULE | Freq: Four times a day (QID) | ORAL | Status: DC | PRN

## 2017-11-29 MED ORDER — SIMETHICONE 80 MG PO CHEW: 80.0000 mg | CHEWABLE_TABLET | ORAL | Status: DC | PRN

## 2017-11-29 MED ORDER — LIDOCAINE HCL (PF) 1 % IJ SOLN
30.0000 mL | INTRAMUSCULAR | Status: DC | PRN
  Filled 2017-11-29: qty 30

## 2017-11-29 MED ORDER — ONDANSETRON HCL 4 MG/2ML IJ SOLN: 4.0000 mg | INTRAMUSCULAR | Status: DC | PRN

## 2017-11-29 MED ORDER — LACTATED RINGERS IV SOLN: 500.0000 mL | INTRAVENOUS | Status: DC | PRN

## 2017-11-29 MED ORDER — ZOLPIDEM TARTRATE 5 MG PO TABS: 5.0000 mg | ORAL_TABLET | Freq: Every evening | ORAL | Status: DC | PRN

## 2017-11-29 MED ORDER — EPHEDRINE 5 MG/ML INJ
10.0000 mg | INTRAVENOUS | Status: DC | PRN
  Filled 2017-11-29: qty 2

## 2017-11-29 MED ORDER — BENZOCAINE-MENTHOL 20-0.5 % EX AERO: 1.0000 "application " | INHALATION_SPRAY | CUTANEOUS | Status: DC | PRN

## 2017-11-29 MED ORDER — WITCH HAZEL-GLYCERIN EX PADS: 1.0000 "application " | MEDICATED_PAD | CUTANEOUS | Status: DC | PRN

## 2017-11-29 MED ORDER — OXYCODONE-ACETAMINOPHEN 5-325 MG PO TABS: 1.0000 | ORAL_TABLET | ORAL | Status: DC | PRN

## 2017-11-29 MED ORDER — OXYTOCIN BOLUS FROM INFUSION
500.0000 mL | Freq: Once | INTRAVENOUS | Status: AC
  Administered 2017-11-29: 500 mL via INTRAVENOUS

## 2017-11-29 MED ORDER — VALACYCLOVIR HCL 500 MG PO TABS
1000.0000 mg | ORAL_TABLET | Freq: Every day | ORAL | Status: DC
  Administered 2017-12-01: 1000 mg via ORAL
  Filled 2017-11-29: qty 2

## 2017-11-29 MED ORDER — OXYCODONE-ACETAMINOPHEN 5-325 MG PO TABS: 2.0000 | ORAL_TABLET | ORAL | Status: DC | PRN

## 2017-11-29 MED ORDER — PNEUMOCOCCAL VAC POLYVALENT 25 MCG/0.5ML IJ INJ
0.5000 mL | INJECTION | INTRAMUSCULAR | Status: AC
  Administered 2017-12-01: 0.5 mL via INTRAMUSCULAR
  Filled 2017-11-29 (×2): qty 0.5

## 2017-11-29 MED ORDER — OXYTOCIN 40 UNITS IN LACTATED RINGERS INFUSION - SIMPLE MED
2.5000 [IU]/h | INTRAVENOUS | Status: DC
  Administered 2017-11-29: 2.5 [IU]/h via INTRAVENOUS
  Filled 2017-11-29: qty 1000

## 2017-11-29 MED ORDER — FENTANYL 2.5 MCG/ML BUPIVACAINE 1/10 % EPIDURAL INFUSION (WH - ANES): 14.0000 mL/h | INTRAMUSCULAR | Status: DC | PRN

## 2017-11-29 MED ORDER — SOD CITRATE-CITRIC ACID 500-334 MG/5ML PO SOLN: 30.0000 mL | ORAL | Status: DC | PRN

## 2017-11-29 MED ORDER — FERROUS SULFATE 325 (65 FE) MG PO TABS
325.0000 mg | ORAL_TABLET | Freq: Every day | ORAL | Status: DC
  Administered 2017-12-01: 325 mg via ORAL
  Filled 2017-11-29: qty 1

## 2017-11-29 MED ORDER — ONDANSETRON HCL 4 MG PO TABS
4.0000 mg | ORAL_TABLET | ORAL | Status: DC | PRN
  Administered 2017-11-30: 4 mg via ORAL
  Filled 2017-11-29: qty 1

## 2017-11-29 MED ORDER — ACETAMINOPHEN 325 MG PO TABS
650.0000 mg | ORAL_TABLET | ORAL | Status: DC | PRN
  Administered 2017-11-29 – 2017-11-30 (×2): 650 mg via ORAL
  Filled 2017-11-29 (×2): qty 2

## 2017-11-29 MED ORDER — DIPHENHYDRAMINE HCL 50 MG/ML IJ SOLN: 12.5000 mg | INTRAMUSCULAR | Status: DC | PRN

## 2017-11-29 MED ORDER — PRENATAL MULTIVITAMIN CH
1.0000 | ORAL_TABLET | Freq: Every day | ORAL | Status: DC
  Administered 2017-12-01: 1 via ORAL
  Filled 2017-11-29: qty 1

## 2017-11-29 NOTE — H&P (Addendum)
LABOR AND DELIVERY ADMISSION HISTORY AND PHYSICAL NOTE  [name] is a 27 y.o. female [phone] with IUP at [redacted]w[redacted]d by L/30 presenting for SOL.  She reports positive fetal movement. She denies leakage of fluid or vaginal bleeding.  Prenatal History/Complications: PNC at [facility] Pregnancy complications:  - HSV on Valtrex, no active lesions - History of MJ use - Late [facility]  Past Medical History: Past Medical History:  Diagnosis Date  . Anemia   . Asthma    as a child  . Blood transfusion without reported diagnosis   . Herpes   . Hypertension   . Pregnancy induced hypertension     Past Surgical History: Past Surgical History:  Procedure Laterality Date  . TONSILLECTOMY    . WISDOM TOOTH EXTRACTION      Obstetrical History: OB History    Gravida  5   Para  4   Term  4   Preterm      AB      Living  4     SAB      TAB      Ectopic      Multiple  0   Live Births  4        Obstetric Comments  High BP, blood transfusion with #4        Social History: Social History   Socioeconomic History  . Marital status: Single    Spouse name: Not on file  . Number of children: Not on file  . Years of education: Not on file  . Highest education level: Not on file  Occupational History  . Not on file  Social Needs  . Financial resource strain: Not on file  . Food insecurity:    Worry: Not on file    Inability: Not on file  . Transportation needs:    Medical: Not on file    Non-medical: Not on file  Tobacco Use  . Smoking status: Current Every Day Smoker    Packs/day: 0.25    Years: 1.00    Pack years: 0.25    Types: Cigarettes  . Smokeless tobacco: Never Used  Substance and Sexual Activity  . Alcohol use: No  . Drug use: No  . Sexual activity: Yes    Birth control/protection: None    Comment: Patient wants her tubes tied  Lifestyle  . Physical activity:    Days per week: Not on file    Minutes per session: Not on file  . Stress: Not on file   Relationships  . Social connections:    Talks on phone: Not on file    Gets together: Not on file    Attends religious service: Not on file    Active member of club or organization: Not on file    Attends meetings of clubs or organizations: Not on file    Relationship status: Not on file  Other Topics Concern  . Not on file  Social History Narrative  . Not on file    Family History: Family History  Problem Relation Age of Onset  . Asthma Mother   . Cancer Mother        lung  . Hypertension Father   . Seizures Son        febrile  . Diabetes Maternal Grandmother     Allergies: No Known Allergies  Medications Prior to Admission  Medication Sig Dispense Refill Last Dose  . ferrous sulfate 325 (65 FE) MG tablet Take 325 mg by  mouth daily with breakfast.   Taking  . metroNIDAZOLE (FLAGYL) 500 MG tablet Take 1 tablet (500 mg total) by mouth 2 (two) times daily. 14 tablet 0   . Prenatal Vit-Fe Fumarate-FA (PRENATAL COMPLETE) 14-0.4 MG TABS Take 1 tablet by mouth daily. 60 each 11 Taking  . Prenatal Vit-Fe Fumarate-FA (PREPLUS) 27-1 MG TABS Take 1 tablet by mouth daily.   Not Taking  . valACYclovir (VALTREX) 1000 MG tablet Take 1 tablet (1,000 mg total) by mouth daily. 30 tablet 1      Review of Systems  All systems reviewed and negative except as stated in HPI  Physical Exam Blood pressure 123/75, pulse 86, temperature 98.4 F (36.9 C), temperature source Oral, last menstrual period 03/01/2017, SpO2 100 %, unknown if currently breastfeeding. General appearance: alert, oriented, NAD Lungs: normal respiratory effort Heart: regular rate Abdomen: soft, non-tender; gravid, FH appropriate for GA Extremities: No calf swelling or tenderness Presentation: cephalic Fetal monitoring: Category 1 Uterine activity: Q3-17min Dilation: 5 Effacement (%): 80 Station: -3 Exam by:: Loretta Parody, RN  Prenatal labs: ABO, Rh: O/Positive/-- (08/27 1523) Antibody: Negative (08/27  1523) Rubella: 9.04 (08/27 1523) RPR: Non Reactive (08/27 1523)  HBsAg: Negative (08/27 1523)  HIV: Non Reactive (08/27 1523)  GC/Chlamydia: Negative GBS:   Negative 2-hr GTT: Normal Genetic screening:  Presented too late Anatomy US: Normal  Prenatal Transfer Tool  Maternal Diabetes: No Genetic Screening: Declined Maternal Ultrasounds/Referrals: Normal Fetal Ultrasounds or other Referrals:  None Maternal Substance Abuse:  No Significant Maternal Medications:  Meds include: Other: Valtrex Significant Maternal Lab Results: None  Results for orders placed or performed during the hospital encounter of 11/29/17 (from the past 24 hour(s))  POCT fern test   Collection Time: 11/29/17 10:15 AM  Result Value Ref Range   POCT Fern Test Negative = intact amniotic membranes     Patient Active Problem List   Diagnosis Date Noted  . Normal labor and delivery 11/29/2017  . Supervision of low-risk pregnancy 09/30/2017  . Late prenatal care affecting pregnancy, antepartum, third trimester 09/30/2017  . History of postpartum hypertension 09/30/2017  . History of herpes simplex infection 08/21/2016  . Marijuana use 08/21/2016  . Tobacco use 08/21/2016    Assessment: JERLINE LINZY is a 27 y.o. [id number] at [redacted]w[redacted]d here for SOL  #Labor: Pitocin if indicated #Pain: Epidural  #FWB: 7lb - 37% at [redacted]w[redacted]d. Cat I strip.  #ID:  GBS neg #MOF: Bottle #MOC:BTL #Circ:  Yes if boy (inpatient)   Loretta Starr, MD 11/29/2017, 10:54 AM   OB FELLOW HISTORY AND PHYSICAL ATTESTATION  I have seen and examined this patient; I agree with above documentation in the resident's note.   [name], D.O. OB Fellow  11/29/2017, 3:56 PM

## 2017-11-29 NOTE — MAU Note (Signed)
Pt states she started having contractions around 0700 this morning, they have gotten stronger and closer together.  States she felt a little gush when she got here. Reports some fetal movement denies VB.

## 2017-11-29 NOTE — Progress Notes (Signed)
MAU CNM Sam checked behind to confirm vertex.

## 2017-11-30 ENCOUNTER — Encounter (HOSPITAL_COMMUNITY): Payer: Self-pay | Admitting: Anesthesiology

## 2017-11-30 ENCOUNTER — Inpatient Hospital Stay (HOSPITAL_COMMUNITY): Payer: Medicaid Other | Admitting: Anesthesiology

## 2017-11-30 ENCOUNTER — Encounter (HOSPITAL_COMMUNITY)
Admission: AD | Disposition: A | Discharge status: Home / Self Care | Payer: Self-pay | Source: Home / Self Care | Attending: Obstetrics & Gynecology

## 2017-11-30 DIAGNOSIS — Z302 Encounter for sterilization: Secondary | ICD-10-CM

## 2017-11-30 HISTORY — PX: TUBAL LIGATION: SHX77

## 2017-11-30 LAB — RAPID URINE DRUG SCREEN, HOSP PERFORMED
Amphetamines: NOT DETECTED
Barbiturates: NOT DETECTED
Benzodiazepines: NOT DETECTED
Cocaine: NOT DETECTED
OPIATES: NOT DETECTED
TETRAHYDROCANNABINOL: POSITIVE — AB

## 2017-11-30 LAB — CBC
HEMATOCRIT: 22.4 % — AB (ref 36.0–46.0)
HEMOGLOBIN: 7 g/dL — AB (ref 12.0–15.0)
MCH: 23.4 pg — ABNORMAL LOW (ref 26.0–34.0)
MCHC: 31.3 g/dL (ref 30.0–36.0)
MCV: 74.9 fL — AB (ref 80.0–100.0)
Platelets: 279 10*3/uL (ref 150–400)
RBC: 2.99 MIL/uL — AB (ref 3.87–5.11)
RDW: 15.2 % (ref 11.5–15.5)
WBC: 12.3 10*3/uL — ABNORMAL HIGH (ref 4.0–10.5)
nRBC: 0 % (ref 0.0–0.2)

## 2017-11-30 LAB — RPR: RPR: NONREACTIVE

## 2017-11-30 SURGERY — LIGATION, FALLOPIAN TUBE, POSTPARTUM
Anesthesia: Spinal | Laterality: Bilateral

## 2017-11-30 MED ORDER — MEPERIDINE HCL 25 MG/ML IJ SOLN: 6.2500 mg | INTRAMUSCULAR | Status: DC | PRN

## 2017-11-30 MED ORDER — ONDANSETRON HCL 4 MG/2ML IJ SOLN
INTRAMUSCULAR | Status: DC | PRN
  Administered 2017-11-30: 4 mg via INTRAVENOUS

## 2017-11-30 MED ORDER — OXYCODONE HCL 5 MG PO TABS
5.0000 mg | ORAL_TABLET | ORAL | Status: DC | PRN
  Administered 2017-11-30 – 2017-12-01 (×5): 5 mg via ORAL
  Filled 2017-11-30 (×5): qty 1

## 2017-11-30 MED ORDER — HYDROMORPHONE HCL 1 MG/ML IJ SOLN
INTRAMUSCULAR | Status: AC
  Administered 2017-11-30: 1 mg
  Filled 2017-11-30: qty 1

## 2017-11-30 MED ORDER — HYDROMORPHONE HCL 1 MG/ML IJ SOLN
INTRAMUSCULAR | Status: AC
  Filled 2017-11-30: qty 0.5

## 2017-11-30 MED ORDER — LACTATED RINGERS IV SOLN
INTRAVENOUS | Status: DC
  Administered 2017-11-30: 07:00:00 via INTRAVENOUS

## 2017-11-30 MED ORDER — PROMETHAZINE HCL 25 MG/ML IJ SOLN: 6.2500 mg | INTRAMUSCULAR | Status: DC | PRN

## 2017-11-30 MED ORDER — ONDANSETRON HCL 4 MG/2ML IJ SOLN
INTRAMUSCULAR | Status: AC
  Filled 2017-11-30: qty 2

## 2017-11-30 MED ORDER — OXYCODONE HCL 5 MG PO TABS: 5.0000 mg | ORAL_TABLET | Freq: Once | ORAL | Status: DC | PRN

## 2017-11-30 MED ORDER — METOCLOPRAMIDE HCL 10 MG PO TABS
10.0000 mg | ORAL_TABLET | Freq: Once | ORAL | Status: AC
  Administered 2017-11-30: 10 mg via ORAL
  Filled 2017-11-30: qty 1

## 2017-11-30 MED ORDER — BUPIVACAINE HCL (PF) 0.75 % IJ SOLN
INTRAMUSCULAR | Status: DC | PRN
  Administered 2017-11-30: 1.6 mL via INTRATHECAL

## 2017-11-30 MED ORDER — BUPIVACAINE HCL (PF) 0.25 % IJ SOLN
INTRAMUSCULAR | Status: DC | PRN
  Administered 2017-11-30: 20 mL

## 2017-11-30 MED ORDER — FAMOTIDINE 20 MG PO TABS
40.0000 mg | ORAL_TABLET | Freq: Once | ORAL | Status: AC
  Administered 2017-11-30: 40 mg via ORAL
  Filled 2017-11-30: qty 2

## 2017-11-30 MED ORDER — FENTANYL CITRATE (PF) 100 MCG/2ML IJ SOLN
INTRAMUSCULAR | Status: DC | PRN
  Administered 2017-11-30 (×2): 50 ug via INTRAVENOUS

## 2017-11-30 MED ORDER — KETOROLAC TROMETHAMINE 30 MG/ML IJ SOLN
30.0000 mg | Freq: Once | INTRAMUSCULAR | Status: AC
  Administered 2017-11-30: 30 mg via INTRAMUSCULAR
  Filled 2017-11-30: qty 1

## 2017-11-30 MED ORDER — HYDROMORPHONE HCL 1 MG/ML IJ SOLN
0.2500 mg | INTRAMUSCULAR | Status: DC | PRN
  Administered 2017-11-30 (×2): 0.5 mg via INTRAVENOUS

## 2017-11-30 MED ORDER — MIDAZOLAM HCL 2 MG/2ML IJ SOLN
INTRAMUSCULAR | Status: AC
  Filled 2017-11-30: qty 2

## 2017-11-30 MED ORDER — OXYCODONE HCL 5 MG/5ML PO SOLN: 5.0000 mg | Freq: Once | ORAL | Status: DC | PRN

## 2017-11-30 MED ORDER — BUPIVACAINE HCL (PF) 0.25 % IJ SOLN
INTRAMUSCULAR | Status: AC
  Filled 2017-11-30: qty 30

## 2017-11-30 MED ORDER — FENTANYL CITRATE (PF) 100 MCG/2ML IJ SOLN
INTRAMUSCULAR | Status: AC
  Filled 2017-11-30: qty 2

## 2017-11-30 MED ORDER — LACTATED RINGERS IV SOLN
INTRAVENOUS | Status: DC | PRN
  Administered 2017-11-30: 10:00:00 via INTRAVENOUS

## 2017-11-30 SURGICAL SUPPLY — 32 items
ADH SKN CLS APL DERMABOND .7 (GAUZE/BANDAGES/DRESSINGS) ×1
ADH SKN CLS LQ APL DERMABOND (GAUZE/BANDAGES/DRESSINGS) ×1
BLADE SURG 15 STRL LF C SS BP (BLADE) ×1 IMPLANT
BLADE SURG 15 STRL SS (BLADE) ×3
CLOTH BEACON ORANGE TIMEOUT ST (SAFETY) ×3 IMPLANT
DERMABOND ADHESIVE PROPEN (GAUZE/BANDAGES/DRESSINGS) ×2
DERMABOND ADVANCED (GAUZE/BANDAGES/DRESSINGS) ×2
DERMABOND ADVANCED .7 DNX12 (GAUZE/BANDAGES/DRESSINGS) ×1 IMPLANT
DERMABOND ADVANCED .7 DNX6 (GAUZE/BANDAGES/DRESSINGS) IMPLANT
DRSG OPSITE POSTOP 3X4 (GAUZE/BANDAGES/DRESSINGS) ×3 IMPLANT
ELECT REM PT RETURN 9FT ADLT (ELECTROSURGICAL)
ELECTRODE REM PT RTRN 9FT ADLT (ELECTROSURGICAL) IMPLANT
GLOVE BIOGEL PI IND STRL 7.0 (GLOVE) ×1 IMPLANT
GLOVE BIOGEL PI IND STRL 8 (GLOVE) ×1 IMPLANT
GLOVE BIOGEL PI INDICATOR 7.0 (GLOVE) ×2
GLOVE BIOGEL PI INDICATOR 8 (GLOVE) ×2
GLOVE ECLIPSE 8.0 STRL XLNG CF (GLOVE) ×3 IMPLANT
GOWN STRL REUS W/TWL LRG LVL3 (GOWN DISPOSABLE) ×6 IMPLANT
NEEDLE HYPO 22GX1.5 SAFETY (NEEDLE) IMPLANT
NS IRRIG 1000ML POUR BTL (IV SOLUTION) ×3 IMPLANT
PACK ABDOMINAL MINOR (CUSTOM PROCEDURE TRAY) ×3 IMPLANT
PENCIL BUTTON HOLSTER BLD 10FT (ELECTRODE) IMPLANT
PROTECTOR NERVE ULNAR (MISCELLANEOUS) ×3 IMPLANT
SPONGE LAP 4X18 RFD (DISPOSABLE) IMPLANT
SUT PLAIN 2 0 (SUTURE) ×6
SUT PLAIN ABS 2-0 CT1 27XMFL (SUTURE) ×2 IMPLANT
SUT VIC AB 0 CT1 27 (SUTURE) ×3
SUT VIC AB 0 CT1 27XBRD ANBCTR (SUTURE) ×1 IMPLANT
SUT VIC AB 4-0 KS 27 (SUTURE) ×3 IMPLANT
SYR CONTROL 10ML LL (SYRINGE) IMPLANT
TOWEL OR 17X24 6PK STRL BLUE (TOWEL DISPOSABLE) ×6 IMPLANT
TRAY FOLEY CATH SILVER 14FR (SET/KITS/TRAYS/PACK) ×3 IMPLANT

## 2017-11-30 NOTE — Progress Notes (Addendum)
Post Partum Day 1 Subjective: no complaints and preparing for OR  Objective: Blood pressure 119/77, pulse 70, temperature 98.5 F (36.9 C), temperature source Oral, resp. rate 17, height 5\' 6"  (1.676 m), weight 62.1 kg, last menstrual period 03/01/2017, SpO2 100 %, unknown if currently breastfeeding.  Physical Exam:  General: alert and cooperative Lochia: appropriate Uterine Fundus: firm Incision: NA DVT Evaluation: No evidence of DVT seen on physical exam.  Recent Labs    11/29/17 1048 11/30/17 0629  HGB 8.8* 7.0*  HCT 27.2* 22.4*    Assessment/Plan: Plan for OR today for BTL Discharge likely tomorrow. Baby will need circ prior to discharge (FOB with [employer])   LOS: 1 day   JACOB E PERRIN 11/30/2017, 10:09 AM   OB FELLOW POSTPARTUM PROGRESS NOTE ATTESTATION  I have seen and examined this patient and agree with above documentation in the resident's note.   [name], D.O. OB Fellow  11/30/2017, 5:56 PM

## 2017-11-30 NOTE — Anesthesia Postprocedure Evaluation (Signed)
Anesthesia Post Note  Patient: [name]  Procedure(s) Performed: POST PARTUM TUBAL LIGATION (Bilateral )     Patient location during evaluation: PACU Anesthesia Type: Spinal Level of consciousness: awake and alert Pain management: pain level controlled Vital Signs Assessment: post-procedure vital signs reviewed and stable Respiratory status: spontaneous breathing, nonlabored ventilation and respiratory function stable Cardiovascular status: blood pressure returned to baseline and stable Postop Assessment: no apparent nausea or vomiting Anesthetic complications: no    Last Vitals:  Vitals:   11/30/17 1215 11/30/17 1230  BP: 113/81 120/78  Pulse: (!) 55 80  Resp: 11 14  Temp:  36.5 C  SpO2: 100% 100%    Last Pain:  Vitals:   11/30/17 1230  TempSrc: Oral  PainSc: 2    Pain Goal: Patients Stated Pain Goal: 2 (11/29/17 2155)               [name]

## 2017-11-30 NOTE — Addendum Note (Signed)
Addendum  created 11/30/17 1806 by Graciela Husbands, CRNA   Sign clinical note

## 2017-11-30 NOTE — Anesthesia Postprocedure Evaluation (Signed)
Anesthesia Post Note  Patient: [name]  Procedure(s) Performed: POST PARTUM TUBAL LIGATION (Bilateral )     Patient location during evaluation: Mother Baby Anesthesia Type: Spinal Level of consciousness: awake and alert and oriented Pain management: satisfactory to patient Vital Signs Assessment: post-procedure vital signs reviewed and stable Respiratory status: respiratory function stable and spontaneous breathing Cardiovascular status: blood pressure returned to baseline Postop Assessment: no headache, no backache, spinal receding, patient able to bend at knees and adequate PO intake Anesthetic complications: no    Last Vitals:  Vitals:   11/30/17 1343 11/30/17 1751  BP: 137/85 111/70  Pulse: 68 67  Resp: 20 18  Temp: 36.6 C 36.7 C  SpO2:  100%    Last Pain:  Vitals:   11/30/17 1751  TempSrc: Oral  PainSc:    Pain Goal: Patients Stated Pain Goal: 2 (11/29/17 2155)               Karleen Dolphin

## 2017-11-30 NOTE — Transfer of Care (Signed)
Immediate Anesthesia Transfer of Care Note  Patient: [name]  Procedure(s) Performed: POST PARTUM TUBAL LIGATION (Bilateral )  Patient Location: PACU  Anesthesia Type:Spinal  Level of Consciousness: awake, alert  and oriented  Airway & Oxygen Therapy: Patient Spontanous Breathing  Post-op Assessment: Report given to RN and Post -op Vital signs reviewed and stable  Post vital signs: Reviewed  Last Vitals:  Vitals Value Taken Time  BP    Temp    Pulse 72 11/30/2017 11:08 AM  Resp 13 11/30/2017 11:08 AM  SpO2 100 % 11/30/2017 11:08 AM  Vitals shown include unvalidated device data.  Last Pain:  Vitals:   11/30/17 0942  TempSrc: Oral  PainSc:       Patients Stated Pain Goal: 2 (11/29/17 2155)  Complications: No apparent anesthesia complications

## 2017-11-30 NOTE — Anesthesia Preprocedure Evaluation (Addendum)
Anesthesia Evaluation  Patient identified by MRN, date of birth, ID band Patient awake    Reviewed: Allergy & Precautions, H&P , NPO status , Patient's Chart, lab work & pertinent test results  Airway Mallampati: II  TM Distance: >3 FB Neck ROM: full    Dental   Pulmonary neg pulmonary ROS, asthma , Current Smoker,    breath sounds clear to auscultation       Cardiovascular hypertension, negative cardio ROS   Rhythm:regular Rate:Normal     Neuro/Psych negative neurological ROS  negative psych ROS   GI/Hepatic negative GI ROS, Neg liver ROS,   Endo/Other  negative endocrine ROS  Renal/GU negative Renal ROS  negative genitourinary   Musculoskeletal negative musculoskeletal ROS (+)   Abdominal   Peds negative pediatric ROS (+)  Hematology negative hematology ROS (+)   Anesthesia Other Findings   Reproductive/Obstetrics negative OB ROS                             Anesthesia Physical  Anesthesia Plan  ASA: II  Anesthesia Plan: Spinal   Post-op Pain Management:    Induction: Intravenous  PONV Risk Score and Plan: 1 and Ondansetron  Airway Management Planned: Simple Face Mask  Additional Equipment:   Intra-op Plan:   Post-operative Plan:   Informed Consent: I have reviewed the patients History and Physical, chart, labs and discussed the procedure including the risks, benefits and alternatives for the proposed anesthesia with the patient or authorized representative who has indicated his/her understanding and acceptance.   Dental advisory given  Plan Discussed with: CRNA  Anesthesia Plan Comments:        Anesthesia Quick Evaluation

## 2017-11-30 NOTE — Op Note (Signed)
[name] 11/29/2017 - 11/30/2017 10:56 AM  PREOPERATIVE DIAGNOSIS:  Undesired fertility  POSTOPERATIVE DIAGNOSIS:  Undesired fertility  PROCEDURE:  Postpartum Bilateral Tubal Sterilization using Modified Pomeroy method   SURGEON: Surgeon(s) and Role:    * Eure, Amaryllis Dyke, MD - Primary    * Arvilla Market, DO - OB Fellow  ANESTHESIA:  Spinal   COMPLICATIONS:  None immediate.  ESTIMATED BLOOD LOSS:  Less than 20cc.  FLUIDS: 800 cc LR.  URINE OUTPUT:  200 cc of clear urine.  INDICATIONS: 27 y.o. yo [id number]  with undesired fertility,status post vaginal delivery, desires permanent sterilization. Risks and benefits of procedure discussed with patient including permanence of method, bleeding, infection, injury to surrounding organs and need for additional procedures. Risk failure of 0.5-1% with increased risk of ectopic gestation if pregnancy occurs was also discussed with patient.   FINDINGS:  Normal uterus, tubes, and ovaries.  TECHNIQUE: After informed consent was obtained, the patient was taken to the operating room where anesthesia was induced and found to be adequate. A small transverse, infraumbilical skin incision was made with the scalpel. This incision was carried down to the underlying layer of fascia. The fascia was grasped with Kocher clamps tented up and entered sharply with Mayo scissors. Underlying peritoneum was then identified tented up and entered sharply with Metzenbaum scissors. The patient's roght fallopian tube was then identified, brought to the incision, and grasped with a Babcock clamp. The tube was then followed out to the fimbria. The Babcock clamp was then used to grasp the tube approximately 4 cm from the cornual region. A 3 cm segment of the tube was then ligated with free tie of plain gut suture, transected and excised. Good hemostasis was noted and the tube was returned to the abdomen. The left fallopian tube was then identified to its fimbriated  end, ligated, and a 3 cm segment excised in a similar fashion. Excellent hemostasis was noted, and the tube returned to the abdomen. The fascia was re-approximated with 0 Vicryl.  20 cc of quarter percent Marcaine solution was then injected at the incision site. The skin was closed in a subcuticular fashion with 3-0 Vicryl. The patient tolerated the procedure well. Sponge, lap, and needle count were correct x2. The patient was taken to recovery room in stable condition.  [name], D.O. OB Fellow  11/30/2017, 10:56 AM

## 2017-11-30 NOTE — Anesthesia Procedure Notes (Signed)
Spinal  Patient location during procedure: OB Start time: 11/30/2017 10:08 AM End time: 11/30/2017 10:13 AM Staffing Anesthesiologist: [name], MD Performed: anesthesiologist  Preanesthetic Checklist Completed: patient identified, surgical consent, pre-op evaluation, timeout performed, IV checked, risks and benefits discussed and monitors and equipment checked Spinal Block Patient position: sitting Prep: site prepped and draped and DuraPrep Patient monitoring: heart rate, cardiac monitor, continuous pulse ox and blood pressure Approach: midline Location: L3-4 Injection technique: single-shot Needle Needle type: Pencan  Needle gauge: 24 G Needle length: 10 cm Assessment Sensory level: T4

## 2017-12-01 ENCOUNTER — Encounter (HOSPITAL_COMMUNITY): Payer: Self-pay

## 2017-12-01 LAB — CBC
HCT: 26.9 % — ABNORMAL LOW (ref 36.0–46.0)
Hemoglobin: 8.4 g/dL — ABNORMAL LOW (ref 12.0–15.0)
MCH: 23.5 pg — ABNORMAL LOW (ref 26.0–34.0)
MCHC: 31.2 g/dL (ref 30.0–36.0)
MCV: 75.1 fL — ABNORMAL LOW (ref 80.0–100.0)
Platelets: 327 10*3/uL (ref 150–400)
RBC: 3.58 MIL/uL — AB (ref 3.87–5.11)
RDW: 15.2 % (ref 11.5–15.5)
WBC: 11.2 10*3/uL — ABNORMAL HIGH (ref 4.0–10.5)
nRBC: 0 % (ref 0.0–0.2)

## 2017-12-01 MED ORDER — IBUPROFEN 800 MG PO TABS: 800.0000 mg | ORAL_TABLET | Freq: Three times a day (TID) | ORAL | 0 refills | Status: AC | PRN

## 2017-12-01 NOTE — Clinical Social Work Maternal (Signed)
CLINICAL SOCIAL WORK MATERNAL/CHILD NOTE  Patient Details  Name: [name] MRN: [id number] Date of Birth: 10/08/1990  Date:  12/01/2017  Clinical Social Worker Initiating Note:  Maral Lampe Boyd-Gilyard Date/Time: Initiated:  12/01/17/1517     Child's Name:  [name]   Biological Parents:  Mother, Father   Need for Interpreter:  None   Reason for Referral:  Late or No Prenatal Care    Address:  [address] [place] [address]    Phone number:  [phone] (home)     Additional phone number:   Household Members/Support Persons (HM/SP):   Household Member/Support Person 1, Household Member/Support Person 2, Household Member/Support Person 3, Household Member/Support Person 4, Household Member/Support Person 5   HM/SP Name Relationship DOB or Age  HM/SP -1 Tashaswn Lowe FOB 04/18/1989  HM/SP -2 Alexander Lilly Jr. 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)  [place]    [place]    [place]    [place]    [place]      Pediatrician Fax Number:    Risk Factors/Current Problems:  None   Cognitive State:  Alert , Able to Concentrate , Linear Thinking ,  Insightful    Mood/Affect:  Interested , Relaxed , Happy , Comfortable    CSW Assessment: CSW met with MOB to complete an assessment for late PNC.  When CSW arrived, MOB was resting in bed bonding with infant. CSW explained CSW's role and encouraged MOB to ask questions. CSW inquired about MOB's LPNC and MOB reported that MOB did not know she was pregnant and when her pregnancy was confirmed, MOB was not sure if she wanted to proceed with the pregnancy. CSW explained the hospital's LPNC policy; MOB understood. MOB denied the use of all illicit substance.  CSW made MOB aware that infant's UDS was negative and CSW will continue to monitor infant's CDS.  CSW informed MOB that if infant's CDS is positive without an explanation, CSW will make a report to [place] CPS.  CSW educated MOB about PPD and informed MOB of possible supports and interventions to decrease PPD.  CSW also encouraged MOB to seek medical attention if needed for increased signs and symptoms for PPD. CSW reviewed safe sleep and SIDS. MOB was knowledgeable.  MOB did not have any questions or concerns at this time, and CSW thanked MOB for allowing CSW to meet with MOB.  CSW Plan/Description:  No Further Intervention Required/No Barriers to Discharge, Sudden Infant Death Syndrome (SIDS) Education, Perinatal Mood   and Anxiety Disorder (PMADs) Education, Child Protective Service Report , CSW Will Continue to Monitor Umbilical Cord Tissue Drug Screen Results and Make Report if Warranted, Hospital Drug Screen Policy Information   There are no barriers to discharge.   Susana Duell Boyd-Gilyard, MSW, LCSW Clinical Social Work [phone]  Danika Kluender D BOYD-GILYARD, LCSW 12/01/2017, 3:35 PM 

## 2017-12-01 NOTE — Progress Notes (Signed)
CSW acknowledges consult and completed clinical assessment.  Clinical documentation will follow.  There are no barriers to d/c.  Kenrick Pore Boyd-Gilyard, MSW, LCSW Clinical Social Work [phone]   

## 2017-12-01 NOTE — Discharge Instructions (Signed)

## 2017-12-01 NOTE — Discharge Summary (Addendum)
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 yo G5P5005 at [redacted]w[redacted]d was admitted in Active Labor on 11/29/2017. Patient had an uncomplicated labor course as follows:  Membrane Rupture Time/Date: 12:14 PM ,11/29/2017   Intrapartum Procedures: Episiotomy: None [1]                                         Lacerations:  None [1]  Patient had a delivery of a Viable infant. 11/29/2017  Information for the patient's newborn:  Mirha, Brucato [[id number]]  Delivery Method: Vaginal, Spontaneous(Filed from Delivery Summary)    Pateint had an uncomplicated postpartum course.  She is ambulating, tolerating a regular diet, passing flatus, and urinating well. Patient is discharged home in stable condition on 12/01/17.   Magnesium Sulfate recieved: No BMZ received: No  Physical exam  Vitals:   11/30/17 1751 11/30/17 2122 12/01/17 0000 12/01/17 0500  BP: 111/70 119/68 120/84 (!) 115/53  Pulse: 67 65 66 60  Resp: 18     Temp: 98.1 F (36.7 C) 98.2 F (36.8 C) 98.3 F (36.8 C) 98.4 F (36.9 C)  TempSrc: Oral Oral Oral Oral  SpO2: 100% 100% 100%   Weight:      Height:       General: alert, cooperative and no distress Lochia: appropriate Uterine Fundus: firm Incision:  N/A DVT Evaluation: No evidence of DVT seen on physical exam. Labs: Lab Results  Component Value Date   WBC 11.2 (H) 12/01/2017   HGB 8.4 (L) 12/01/2017   HCT 26.9 (L) 12/01/2017   MCV 75.1 (L) 12/01/2017   PLT 327 12/01/2017   CMP Latest Ref Rng & Units 08/19/2017  Glucose 70 - 99 mg/dL 80  BUN 6 - 20 mg/dL <7(W)  Creatinine 2.95 - 1.00 mg/dL 6.21  Sodium 308 - 657 mmol/L 135  Potassium 3.5 - 5.1 mmol/L 3.2(L)  Chloride 98 - 111 mmol/L 103  CO2 22 - 32 mmol/L 25  Calcium 8.9 - 10.3 mg/dL 8.4(O)  Total Protein 6.5 - 8.1 g/dL 6.5  Total Bilirubin 0.3 - 1.2 mg/dL 0.5  Alkaline Phos 38 - 126 U/L 59  AST 15 - 41 U/L 16  ALT 0 - 44 U/L 17  Discharge instruction: per After Visit Summary and "Baby and Me Booklet".  After visit meds:  Allergies as of 12/01/2017   No Known Allergies     Medication List    TAKE these medications   acetaminophen 500 MG tablet Commonly known as:  TYLENOL Take 1,000 mg by mouth every 6 (six) hours as needed for mild pain or headache.   ferrous sulfate 325 (65 FE) MG tablet Take 325 mg by mouth daily with breakfast.   ibuprofen 800 MG tablet Commonly known as:  ADVIL,MOTRIN Take 1 tablet (800 mg total) by mouth every 8 (eight) hours as needed.   PRENATAL COMPLETE 14-0.4 MG Tabs Take 1 tablet by mouth daily.   valACYclovir 1000 MG tablet Commonly known as:  VALTREX Take 1 tablet (1,000 mg total) by mouth daily.       Diet: routine diet  Activity: Advance as tolerated. Pelvic rest for 6 weeks.   Outpatient follow up:4 weeks Follow up Appt: No future appointments. Follow up Visit: Follow-up Information    [facility] 4 week(s).   Specialty:  Obstetrics and Gynecology Why:  Your provider has sent a message to clinic on your behalf. Clinic staff will call you to schedule your 4 week postpartum appointment. Contact information: [address] [place]  [address] [phone]           Please schedule this patient for Postpartum visit in: 4 weeks with the following provider: Any provider For C/S patients schedule nurse incision check in weeks 2 weeks: no Low risk pregnancy complicated by: substance abuse, HSV Delivery mode:  SVD Anticipated Birth Control:  BTL done PP PP Procedures needed: None  Schedule Integrated BH visit: yes      Newborn Data: Live born female  Birth Weight: 6 lb 9.3 oz (2985 g) APGAR: 9, 9  Newborn Delivery   Birth date/time:  11/29/2017 12:15:00 Delivery type:  Vaginal, Spontaneous     Baby Feeding: Bottle Disposition:home with mother   Allayne Stack, DO  Family Medicine PGY-1    I confirm that I have verified the information documented in the resident's note and that I have also personally performed the physical exam and all medical decision making activities.   Clayton Bibles, CNM 12/02/17  12:59 PM

## 2017-12-05 ENCOUNTER — Encounter: Payer: Self-pay | Admitting: Obstetrics & Gynecology

## 2017-12-18 ENCOUNTER — Encounter: Payer: Self-pay | Admitting: *Deleted

## 2018-01-09 ENCOUNTER — Ambulatory Visit: Payer: Self-pay | Admitting: Student

## 2018-01-09 ENCOUNTER — Encounter: Payer: Self-pay | Admitting: Student

## 2018-01-09 ENCOUNTER — Telehealth: Payer: Self-pay | Admitting: Student

## 2018-01-09 ENCOUNTER — Ambulatory Visit: Payer: Self-pay

## 2018-01-09 NOTE — Telephone Encounter (Signed)
LVM for patient to give the office a call to get her postpartum appt rescheduled

## 2018-07-06 ENCOUNTER — Encounter: Payer: Self-pay | Admitting: *Deleted

## 2021-09-19 ENCOUNTER — Ambulatory Visit
Admission: RE | Admit: 2021-09-19 | Discharge: 2021-09-19 | Disposition: A | Discharge status: Home / Self Care | Payer: Medicaid Other | Source: Ambulatory Visit | Attending: Internal Medicine | Admitting: Internal Medicine

## 2021-09-19 VITALS — BP 102/70 | HR 95 | Temp 100.8°F | Resp 16

## 2021-09-19 DIAGNOSIS — U071 COVID-19: Secondary | ICD-10-CM | POA: Diagnosis not present

## 2021-09-19 DIAGNOSIS — R509 Fever, unspecified: Secondary | ICD-10-CM

## 2021-09-19 DIAGNOSIS — B349 Viral infection, unspecified: Secondary | ICD-10-CM | POA: Insufficient documentation

## 2021-09-19 LAB — SARS CORONAVIRUS 2 BY RT PCR: SARS Coronavirus 2 by RT PCR: POSITIVE — AB

## 2021-09-19 LAB — POCT INFLUENZA A/B
Influenza A, POC: NEGATIVE
Influenza B, POC: NEGATIVE

## 2021-09-19 MED ORDER — ACETAMINOPHEN 325 MG PO TABS
650.0000 mg | ORAL_TABLET | Freq: Once | ORAL | Status: AC
  Administered 2021-09-19: 650 mg via ORAL

## 2021-09-19 NOTE — ED Triage Notes (Signed)
Pt c/o cough that is getting up mucous, congestion, body aches, chills, headache, sinus pressure that started yesterday. Took nyquil but father told to stop taking it.

## 2021-09-19 NOTE — Discharge Instructions (Signed)
It appears that you have a viral illness as we discussed.  Rapid flu is negative.  COVID test is pending.  We will call if it is positive.  Please follow-up if symptoms persist or worsen.  Recommend over-the-counter symptomatic treatment as we discussed as well.

## 2021-09-19 NOTE — ED Provider Notes (Signed)
[facility]    CSN: [id number] Arrival date & time: 09/19/21  1703      History   Chief Complaint Chief Complaint  Patient presents with   appt 5   Cough   Nasal Congestion    HPI [name] is a 31 y.o. female.   Patient presents with generalized body aches, nasal congestion, chills, headache, sinus pressure, cough that started yesterday.  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  Patient has not taken her temperature with a thermometer but has felt feverish.  Denies chest pain, shortness of breath, nausea, vomiting, diarrhea, abdominal pain, sore throat, ear pain.  Has taken NyQuil with minimal improvement of symptoms.   Cough   Past Medical History:  Diagnosis Date   Anemia    Asthma    as a child   Blood transfusion without reported diagnosis    Herpes    Hypertension    Pregnancy induced hypertension     Patient Active Problem List   Diagnosis Date Noted   Normal labor and delivery 11/29/2017   Vaginal delivery 11/29/2017   Supervision of low-risk pregnancy 09/30/2017   Late prenatal care affecting pregnancy, antepartum, third trimester 09/30/2017   History of postpartum hypertension 09/30/2017   History of herpes simplex infection 08/21/2016   Marijuana use 08/21/2016   Tobacco use 08/21/2016    Past Surgical History:  Procedure Laterality Date   TONSILLECTOMY     TUBAL LIGATION Bilateral 11/30/2017   Procedure: POST PARTUM TUBAL LIGATION;  Surgeon: Lazaro Arms, MD;  Location: [facility];  Service: Gynecology;  Laterality: Bilateral;   WISDOM TOOTH EXTRACTION      OB History     Gravida  5   Para  5   Term  5   Preterm      AB      Living  5      SAB      IAB      Ectopic      Multiple  0   Live Births  5        Obstetric Comments  High BP, blood transfusion with #4          Home Medications    Prior to Admission medications    Medication Sig Start Date End Date Taking? Authorizing Provider  acetaminophen (TYLENOL) 500 MG tablet Take 1,000 mg by mouth every 6 (six) hours as needed for mild pain or headache.    [provider]  ferrous sulfate 325 (65 FE) MG tablet Take 325 mg by mouth daily with breakfast.    [provider]  ibuprofen (ADVIL,MOTRIN) 800 MG tablet Take 1 tablet (800 mg total) by mouth every 8 (eight) hours as needed. 12/01/17   Calvert Cantor, CNM  Prenatal Vit-Fe Fumarate-FA (PRENATAL COMPLETE) 14-0.4 MG TABS Take 1 tablet by mouth daily. 09/30/17   Marny Lowenstein, PA-C  valACYclovir (VALTREX) 1000 MG tablet Take 1 tablet (1,000 mg total) by mouth daily. 11/10/17   Armando Reichert, CNM    Family History Family History  Problem Relation Age of Onset   Asthma Mother    Cancer Mother        lung   Hypertension Father    Seizures Son        febrile   Diabetes Maternal Grandmother     Social History Social History   Tobacco Use   Smoking status: Every  Day    Packs/day: 0.25    Years: 1.00    Total pack years: 0.25    Types: Cigarettes   Smokeless tobacco: Never  Substance Use Topics   Alcohol use: No   Drug use: No     Allergies   Patient has no known allergies.   Review of Systems Review of Systems Per HPI  Physical Exam Triage Vital Signs ED Triage Vitals [09/19/21 1725]  Enc Vitals Group     BP 102/70     Pulse Rate 95     Resp 16     Temp (!) 101.9 F (38.8 C)     Temp Source Oral     SpO2 97 %     Weight      Height      Head Circumference      Peak Flow      Pain Score 7     Pain Loc      Pain Edu?      Excl. in GC?    No data found.  Updated Vital Signs BP 102/70 (BP Location: Left Arm)   Pulse 95   Temp (!) 100.8 F (38.2 C) (Oral)   Resp 16   LMP 09/10/2021   SpO2 97%   Visual Acuity Right Eye Distance:   Left Eye Distance:   Bilateral Distance:    Right Eye Near:   Left Eye Near:    Bilateral Near:      Physical Exam Constitutional:      General: She is not in acute distress.    Appearance: Normal appearance. She is not toxic-appearing or diaphoretic.  HENT:     Head: Normocephalic and atraumatic.     Right Ear: Tympanic membrane and ear canal normal.     Left Ear: Tympanic membrane and ear canal normal.     Nose: Congestion present.     Mouth/Throat:     Mouth: Mucous membranes are moist.     Pharynx: No posterior oropharyngeal erythema.  Eyes:     Extraocular Movements: Extraocular movements intact.     Conjunctiva/sclera: Conjunctivae normal.     Pupils: Pupils are equal, round, and reactive to light.  Cardiovascular:     Rate and Rhythm: Normal rate and regular rhythm.     Pulses: Normal pulses.     Heart sounds: Normal heart sounds.  Pulmonary:     Effort: Pulmonary effort is normal. No respiratory distress.     Breath sounds: Normal breath sounds. No stridor. No wheezing, rhonchi or rales.  Abdominal:     General: Abdomen is flat. Bowel sounds are normal.     Palpations: Abdomen is soft.  Musculoskeletal:        General: Normal range of motion.     Cervical back: Normal range of motion.  Skin:    General: Skin is warm and dry.  Neurological:     General: No focal deficit present.     Mental Status: She is alert and oriented to person, place, and time. Mental status is at baseline.  Psychiatric:        Mood and Affect: Mood normal.        Behavior: Behavior normal.      UC Treatments / Results  Labs (all labs ordered are listed, but only abnormal results are displayed) Labs Reviewed  SARS CORONAVIRUS 2 BY RT PCR  BASIC METABOLIC PANEL  POCT INFLUENZA A/B    EKG   Radiology No results found.  Procedures  Procedures (including critical care time)  Medications Ordered in UC Medications  acetaminophen (TYLENOL) tablet 650 mg (650 mg Oral Given 09/19/21 1745)    Initial Impression / Assessment and Plan / UC Course  I have reviewed the triage vital  signs and the nursing notes.  Pertinent labs & imaging results that were available during my care of the patient were reviewed by me and considered in my medical decision making (see chart for details).     Patient presents with symptoms likely from a viral upper respiratory infection. Differential includes bacterial pneumonia, sinusitis, allergic rhinitis, COVID-19, flu. Do not suspect underlying cardiopulmonary process. Symptoms seem unlikely related to ACS, CHF or COPD exacerbations, pneumonia, pneumothorax. Patient is nontoxic appearing and not in need of emergent medical intervention.  Rapid flu was negative.  COVID test pending.  Recommended symptom control with over the counter medications: Daily oral anti-histamine, Oral decongestant or IN corticosteroid, saline irrigations, cepacol lozenges, Robitussin, Delsym, honey tea.  Patient would like to have COVID antiviral if COVID test is positive and within 5-day treatment window.  BMP pending to assess GFR.  Patient would qualify for Paxlovid or molnupiravir.  Return if symptoms fail to improve in 1-2 weeks or you develop shortness of breath, chest pain, severe headache. Patient states understanding and is agreeable.  Discharged with PCP followup.  Final Clinical Impressions(s) / UC Diagnoses   Final diagnoses:  Viral illness  Fever, unspecified     Discharge Instructions      It appears that you have a viral illness as we discussed.  Rapid flu is negative.  COVID test is pending.  We will call if it is positive.  Please follow-up if symptoms persist or worsen.  Recommend over-the-counter symptomatic treatment as we discussed as well.     ED Prescriptions   None    PDMP not reviewed this encounter.   Gustavus Bryant, [place] 09/19/21 1821

## 2021-09-20 ENCOUNTER — Telehealth: Payer: Self-pay

## 2021-09-21 LAB — BASIC METABOLIC PANEL
BUN/Creatinine Ratio: 9 (ref 9–23)
BUN: 8 mg/dL (ref 6–20)
CO2: 20 mmol/L (ref 20–29)
Calcium: 9.5 mg/dL (ref 8.7–10.2)
Chloride: 101 mmol/L (ref 96–106)
Creatinine, Ser: 0.85 mg/dL (ref 0.57–1.00)
Glucose: 88 mg/dL (ref 70–99)
Potassium: 4.6 mmol/L (ref 3.5–5.2)
Sodium: 136 mmol/L (ref 134–144)
eGFR: 94 mL/min/{1.73_m2} (ref >59)
